# Patient Record
Sex: Male | Born: 1940 | Race: White | Hispanic: No | Marital: Married | Smoking: Former smoker
Health system: Southern US, Community
[De-identification: ages and names within clinical notes are randomized; demographics above are authoritative.]

## PROBLEM LIST (undated history)

## (undated) DIAGNOSIS — K219 Gastro-esophageal reflux disease without esophagitis: Secondary | ICD-10-CM

## (undated) DIAGNOSIS — J8410 Pulmonary fibrosis, unspecified: Secondary | ICD-10-CM

## (undated) DIAGNOSIS — C61 Malignant neoplasm of prostate: Secondary | ICD-10-CM

## (undated) DIAGNOSIS — E039 Hypothyroidism, unspecified: Secondary | ICD-10-CM

## (undated) DIAGNOSIS — E7800 Pure hypercholesterolemia, unspecified: Secondary | ICD-10-CM

## (undated) HISTORY — DX: Pulmonary fibrosis, unspecified: J84.10

## (undated) HISTORY — DX: Hypothyroidism, unspecified: E03.9

## (undated) HISTORY — DX: Malignant neoplasm of prostate: C61

## (undated) HISTORY — DX: Pure hypercholesterolemia, unspecified: E78.00

---

## 2002-07-02 ENCOUNTER — Emergency Department (HOSPITAL_COMMUNITY): Admission: EM | Admit: 2002-07-02 | Discharge: 2002-07-02 | Payer: Self-pay | Admitting: Emergency Medicine

## 2005-12-28 ENCOUNTER — Encounter: Admission: RE | Admit: 2005-12-28 | Discharge: 2005-12-28 | Payer: Self-pay | Admitting: Family Medicine

## 2007-04-24 ENCOUNTER — Encounter: Admission: RE | Admit: 2007-04-24 | Discharge: 2007-04-24 | Payer: Self-pay | Admitting: Family Medicine

## 2008-08-19 ENCOUNTER — Ambulatory Visit: Payer: Self-pay | Admitting: Vascular Surgery

## 2008-09-02 ENCOUNTER — Encounter (INDEPENDENT_AMBULATORY_CARE_PROVIDER_SITE_OTHER): Payer: Self-pay | Admitting: Cardiology

## 2008-09-02 ENCOUNTER — Ambulatory Visit (HOSPITAL_COMMUNITY): Admission: RE | Admit: 2008-09-02 | Discharge: 2008-09-02 | Payer: Self-pay | Admitting: Cardiology

## 2010-02-07 HISTORY — PX: INSERTION PROSTATE RADIATION SEED: SUR718

## 2010-02-10 ENCOUNTER — Ambulatory Visit (HOSPITAL_COMMUNITY)
Admission: RE | Admit: 2010-02-10 | Discharge: 2010-02-10 | Payer: Self-pay | Source: Home / Self Care | Attending: Urology | Admitting: Urology

## 2010-02-12 ENCOUNTER — Ambulatory Visit
Admission: RE | Admit: 2010-02-12 | Discharge: 2010-03-09 | Payer: Self-pay | Source: Home / Self Care | Attending: Radiation Oncology | Admitting: Radiation Oncology

## 2010-03-10 ENCOUNTER — Ambulatory Visit: Payer: 59 | Attending: Radiation Oncology | Admitting: Radiation Oncology

## 2010-03-10 DIAGNOSIS — R3911 Hesitancy of micturition: Secondary | ICD-10-CM | POA: Insufficient documentation

## 2010-03-10 DIAGNOSIS — C61 Malignant neoplasm of prostate: Secondary | ICD-10-CM | POA: Insufficient documentation

## 2010-03-10 DIAGNOSIS — Z510 Encounter for antineoplastic radiation therapy: Secondary | ICD-10-CM | POA: Insufficient documentation

## 2010-03-10 DIAGNOSIS — R350 Frequency of micturition: Secondary | ICD-10-CM | POA: Insufficient documentation

## 2010-03-19 ENCOUNTER — Other Ambulatory Visit: Payer: Self-pay | Admitting: Urology

## 2010-03-19 ENCOUNTER — Inpatient Hospital Stay (HOSPITAL_BASED_OUTPATIENT_CLINIC_OR_DEPARTMENT_OTHER)
Admission: RE | Admit: 2010-03-19 | Discharge: 2010-03-19 | Disposition: A | Discharge status: Home / Self Care | Payer: Medicare Other | Source: Ambulatory Visit

## 2010-03-19 ENCOUNTER — Ambulatory Visit
Admission: RE | Admit: 2010-03-19 | Discharge: 2010-03-19 | Disposition: A | Discharge status: Home / Self Care | Payer: 59 | Source: Ambulatory Visit | Attending: Urology | Admitting: Urology

## 2010-03-19 DIAGNOSIS — Z01811 Encounter for preprocedural respiratory examination: Secondary | ICD-10-CM

## 2010-03-19 DIAGNOSIS — C61 Malignant neoplasm of prostate: Secondary | ICD-10-CM

## 2010-04-20 ENCOUNTER — Ambulatory Visit (HOSPITAL_COMMUNITY)
Admission: RE | Admit: 2010-04-20 | Discharge: 2010-04-20 | Disposition: A | Discharge status: Home / Self Care | Payer: 59 | Source: Ambulatory Visit | Attending: Urology | Admitting: Urology

## 2010-04-20 LAB — COMPREHENSIVE METABOLIC PANEL
ALT: 33 U/L (ref 0–53)
Albumin: 4.1 g/dL (ref 3.5–5.2)
Alkaline Phosphatase: 48 U/L (ref 39–117)
Glucose, Bld: 95 mg/dL (ref 70–99)
Potassium: 4.5 mEq/L (ref 3.5–5.1)
Sodium: 136 mEq/L (ref 135–145)
Total Protein: 6.8 g/dL (ref 6.0–8.3)

## 2010-04-20 LAB — CBC
HCT: 41.5 % (ref 39.0–52.0)
Hemoglobin: 14.6 g/dL (ref 13.0–17.0)
MCHC: 35.2 g/dL (ref 30.0–36.0)

## 2010-04-20 LAB — PROTIME-INR: INR: 1.01 (ref 0.00–1.49)

## 2010-04-20 LAB — APTT: aPTT: 34 seconds (ref 24–37)

## 2010-04-27 ENCOUNTER — Ambulatory Visit (HOSPITAL_COMMUNITY): Payer: 59 | Attending: Urology

## 2010-04-27 ENCOUNTER — Ambulatory Visit (HOSPITAL_BASED_OUTPATIENT_CLINIC_OR_DEPARTMENT_OTHER)
Admission: RE | Admit: 2010-04-27 | Discharge: 2010-04-27 | Disposition: A | Discharge status: Home / Self Care | Payer: 59 | Source: Ambulatory Visit | Attending: Urology | Admitting: Urology

## 2010-04-27 DIAGNOSIS — Z01818 Encounter for other preprocedural examination: Secondary | ICD-10-CM | POA: Insufficient documentation

## 2010-04-27 DIAGNOSIS — C61 Malignant neoplasm of prostate: Secondary | ICD-10-CM | POA: Insufficient documentation

## 2010-04-27 DIAGNOSIS — Z79899 Other long term (current) drug therapy: Secondary | ICD-10-CM | POA: Insufficient documentation

## 2010-04-27 DIAGNOSIS — Z01810 Encounter for preprocedural cardiovascular examination: Secondary | ICD-10-CM | POA: Insufficient documentation

## 2010-04-27 DIAGNOSIS — Z510 Encounter for antineoplastic radiation therapy: Secondary | ICD-10-CM | POA: Insufficient documentation

## 2010-04-27 DIAGNOSIS — Z01812 Encounter for preprocedural laboratory examination: Secondary | ICD-10-CM | POA: Insufficient documentation

## 2010-05-03 NOTE — Op Note (Signed)
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  OPERATION:  Prostate brachytherapy, cystoscopy, fluoroscopy.  ANESTHESIA:  General.  SURGEON: 1. Courtney Paris, MD 2. 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  He is admitted now for definitive seed implant for therapy.  He has had 5 weeks of external radiation and has been followed by the seeds today.  His PSA was 5.3 in October 2011.  He had high risk prostate cancer with a large volume of this 20 g prostate involved by Gleason 4+3 adenocarcinoma of the prostate.  He did well with the external radiation, but required Flomax halfway through.  He enters now for the seed therapy, understanding risks and complications there.  The patient was placed on the operating table in dorsal lithotomy position after satisfactory induction of general anesthesia, was prepped and draped with Betadine in the usual sterile fashion.  He is given IV antibiotics.  Time-out was then performed and the patient and procedure then reconfirmed.  He was prepped and draped and the Foley catheter, rectal tube, and the 7.5 MHz ultrasound probe was inserted in his prostate.  Dr. Dayton Scrape then planned the treatment.  I was called back to the operating room after the treatment plan was completed.  A second time-out was then performed.  The needles were then placed with the Nucletron.  A total of 30 needles were used with 77 seeds.  One seed at the  end of the case jammed with the stylet from the Nucletron and had to be inserted manually.  At the completion of the case, the fluoroscopy was used to examine the seeds and they all looked like they conformed well to the prostate.  The patient was then reprepped and draped and a flexible cystoscopy was done and no anterior strictures were seen.  Posterior urethra was normal.  The bladder was entered.  The bladder was smooth, 2+ trabeculated, but otherwise had no bladder mucosal lesions seen.  No seeds were seen within the bladder.  Even on the retrograde view of the prostate, no seeds were seen.  Scope was removed.  A #18 Foley catheter was then reinserted and left to straight drainage.  The patient was then taken to recovery room in good condition to be later discharged as an outpatient.  Catheter will be removed in 48 hours and I will see him in 3 weeks for followup.     Courtney Paris, M.D.     HMK/MEDQ  D:  04/27/2010  T:  04/27/2010  Job:  [id number]  Electronically Signed by Vic Blackbird M.D. on 05/03/2010 05:22:48 PM

## 2010-05-18 ENCOUNTER — Ambulatory Visit: Payer: 59 | Attending: Radiation Oncology | Admitting: Radiation Oncology

## 2010-05-18 DIAGNOSIS — C61 Malignant neoplasm of prostate: Secondary | ICD-10-CM | POA: Insufficient documentation

## 2010-05-18 DIAGNOSIS — R3989 Other symptoms and signs involving the genitourinary system: Secondary | ICD-10-CM | POA: Insufficient documentation

## 2010-05-18 DIAGNOSIS — Z483 Aftercare following surgery for neoplasm: Secondary | ICD-10-CM | POA: Insufficient documentation

## 2010-05-18 DIAGNOSIS — Z923 Personal history of irradiation: Secondary | ICD-10-CM | POA: Insufficient documentation

## 2010-06-22 NOTE — Procedures (Signed)
CAROTID DUPLEX EXAM   INDICATION:  Questionable right retinal occlusion.   HISTORY:  Diabetes:  No  Cardiac:  No  Hypertension:  No  Smoking:  No  Previous Surgery:  No  CV History:  No  Amaurosis Fugax No, Paresthesias No, Hemiparesis No                                       RIGHT             LEFT  Brachial systolic pressure:         160               150  Brachial Doppler waveforms:         Biphasic          Biphasic  Vertebral direction of flow:        Antegrade         Antegrade  DUPLEX VELOCITIES (cm/sec)  CCA peak systolic                   80                85  ECA peak systolic                   114               110  ICA peak systolic                   56                74  ICA end diastolic                   21                19  PLAQUE MORPHOLOGY:                  Calcified         Calcified  PLAQUE AMOUNT:                      Mild              Mild  PLAQUE LOCATION:                    ICA and ECA       ICA and ECA   IMPRESSION:  1. 20-39% stenosis noted in bilateral internal carotid arteries.  2. Increased velocity noted in the left vertebral artery.  3. Antegrade bilateral vertebral arteries.        ___________________________________________  Quita Skye [place], M.D.   MG/MEDQ  D:  08/19/2008  T:  08/19/2008  Job:  [id number]

## 2010-09-01 ENCOUNTER — Ambulatory Visit
Admission: RE | Admit: 2010-09-01 | Discharge: 2010-09-01 | Disposition: A | Discharge status: Home / Self Care | Payer: Medicare Other | Source: Ambulatory Visit | Attending: Radiation Oncology | Admitting: Radiation Oncology

## 2010-11-03 ENCOUNTER — Ambulatory Visit: Payer: Medicare Other | Admitting: Radiation Oncology

## 2010-11-09 ENCOUNTER — Ambulatory Visit
Admission: RE | Admit: 2010-11-09 | Discharge: 2010-11-09 | Disposition: A | Discharge status: Home / Self Care | Payer: Medicare Other | Source: Ambulatory Visit | Attending: Radiation Oncology | Admitting: Radiation Oncology

## 2011-04-28 ENCOUNTER — Other Ambulatory Visit: Payer: Self-pay | Admitting: Cardiology

## 2013-01-15 ENCOUNTER — Other Ambulatory Visit: Payer: Self-pay | Admitting: Family Medicine

## 2013-01-15 ENCOUNTER — Other Ambulatory Visit (HOSPITAL_COMMUNITY): Payer: Self-pay | Admitting: Family Medicine

## 2013-01-15 ENCOUNTER — Ambulatory Visit
Admission: RE | Admit: 2013-01-15 | Discharge: 2013-01-15 | Disposition: A | Discharge status: Home / Self Care | Payer: Medicare Other | Source: Ambulatory Visit | Attending: Family Medicine | Admitting: Family Medicine

## 2013-01-15 DIAGNOSIS — R0602 Shortness of breath: Secondary | ICD-10-CM

## 2013-01-15 DIAGNOSIS — R0600 Dyspnea, unspecified: Secondary | ICD-10-CM

## 2013-01-15 LAB — PULMONARY FUNCTION TEST
DL/VA: 2.25 ml/min/mmHg/L
FEF2575-%Change-Post: -62 %
FEF2575-%Pred-Post: 44 %
FEV1-%Pred-Post: 65 %
FEV1-%Pred-Pre: 78 %
FEV1-Post: 1.89 L
FEV1-Pre: 2.28 L
FEV1FVC-%Change-Post: -6 %
FEV1FVC-%Pred-Pre: 117 %
FVC-%Change-Post: -11 %
Pre FEV1/FVC ratio: 86 %
Pre FEV6/FVC Ratio: 100 %
RV % pred: 67 %
TLC: 4.25 L

## 2013-01-21 ENCOUNTER — Encounter (HOSPITAL_COMMUNITY): Payer: Medicare Other

## 2013-01-25 ENCOUNTER — Ambulatory Visit (HOSPITAL_COMMUNITY)
Admission: RE | Admit: 2013-01-25 | Discharge: 2013-01-25 | Disposition: A | Discharge status: Home / Self Care | Payer: Medicare Other | Source: Ambulatory Visit | Attending: Family Medicine | Admitting: Family Medicine

## 2013-01-25 DIAGNOSIS — R059 Cough, unspecified: Secondary | ICD-10-CM | POA: Insufficient documentation

## 2013-01-25 DIAGNOSIS — R0989 Other specified symptoms and signs involving the circulatory and respiratory systems: Secondary | ICD-10-CM | POA: Insufficient documentation

## 2013-01-25 DIAGNOSIS — R0609 Other forms of dyspnea: Secondary | ICD-10-CM | POA: Insufficient documentation

## 2013-01-25 DIAGNOSIS — R05 Cough: Secondary | ICD-10-CM | POA: Insufficient documentation

## 2013-01-25 DIAGNOSIS — R0602 Shortness of breath: Secondary | ICD-10-CM | POA: Insufficient documentation

## 2013-01-25 MED ORDER — ALBUTEROL SULFATE (5 MG/ML) 0.5% IN NEBU
2.5000 mg | INHALATION_SOLUTION | Freq: Once | RESPIRATORY_TRACT | Status: AC
  Administered 2013-01-25: 2.5 mg via RESPIRATORY_TRACT

## 2013-01-28 ENCOUNTER — Ambulatory Visit
Admission: RE | Admit: 2013-01-28 | Discharge: 2013-01-28 | Disposition: A | Discharge status: Home / Self Care | Payer: Medicare Other | Source: Ambulatory Visit | Attending: Family Medicine | Admitting: Family Medicine

## 2013-01-28 ENCOUNTER — Other Ambulatory Visit: Payer: Self-pay | Admitting: Family Medicine

## 2013-01-28 ENCOUNTER — Encounter (HOSPITAL_COMMUNITY): Payer: Medicare Other

## 2013-01-28 DIAGNOSIS — R0600 Dyspnea, unspecified: Secondary | ICD-10-CM

## 2013-02-01 ENCOUNTER — Telehealth: Payer: Self-pay | Admitting: Internal Medicine

## 2013-02-01 NOTE — Telephone Encounter (Signed)
Called and spoke with pts wife and she stated that the pt was referred to see MW by Dr. Clovis Riley.  She stated that they gave pt an inhaler of the proair on Friday and this is not helping him and his SOB is worse today.  i advised the pts wife to call Dr. Quita Skye office to see if they can offer any other recs.  She wanted to know if MW may have a cancellation on Monday so the pt may be seen earlier.  Will forward to MW and leslie to see if pt can be worked in on Monday.  Thanks   Allergies not on file

## 2013-02-03 NOTE — Telephone Encounter (Signed)
Ok to work in at end of am or pm office

## 2013-02-04 ENCOUNTER — Encounter: Payer: Self-pay | Admitting: Internal Medicine

## 2013-02-04 ENCOUNTER — Ambulatory Visit (INDEPENDENT_AMBULATORY_CARE_PROVIDER_SITE_OTHER): Payer: Medicare Other | Admitting: Internal Medicine

## 2013-02-04 VITALS — BP 122/80 | HR 80 | Temp 97.6°F | Ht 68.0 in | Wt 172.8 lb

## 2013-02-04 DIAGNOSIS — J8410 Pulmonary fibrosis, unspecified: Secondary | ICD-10-CM

## 2013-02-04 DIAGNOSIS — J9610 Chronic respiratory failure, unspecified whether with hypoxia or hypercapnia: Secondary | ICD-10-CM

## 2013-02-04 DIAGNOSIS — J84112 Idiopathic pulmonary fibrosis: Secondary | ICD-10-CM | POA: Insufficient documentation

## 2013-02-04 MED ORDER — PANTOPRAZOLE SODIUM 40 MG PO TBEC: 40.0000 mg | DELAYED_RELEASE_TABLET | Freq: Every day | ORAL | Status: DC

## 2013-02-04 MED ORDER — FAMOTIDINE 20 MG PO TABS: ORAL_TABLET | ORAL | Status: DC

## 2013-02-04 NOTE — Patient Instructions (Signed)
Please see patient coordinator before you leave today  to schedule High resolution CT chest  Pantoprazole (protonix) 40 mg Take 30-60 min before first meal of the day and Pepcid 20 mg one bedtime until return to office - this is the best way to tell whether stomach acid is contributing to your problem.   GERD (REFLUX)  is an extremely common cause of respiratory symptoms, many times with no significant heartburn at all.    It can be treated with medication, but also with lifestyle changes including avoidance of late meals, excessive alcohol, smoking cessation, and avoid fatty foods, chocolate, peppermint, colas, red wine, and acidic juices such as orange juice.  NO MINT OR MENTHOL PRODUCTS SO NO COUGH DROPS  USE SUGARLESS CANDY INSTEAD (jolley ranchers or Stover's)  NO OIL BASED VITAMINS - use powdered substitutes.   Please schedule a follow up office visit in 4 weeks, call sooner if needed

## 2013-02-04 NOTE — Progress Notes (Signed)
Subjective:     Patient ID: Bernard Griffith, male   DOB: 08-28-1940   MRN: [id number]  HPI  73 yowm quit smoking in 1962 with dx pna around 2011 and doe ever since eval by cardiology with "ok heart" and referred 02/04/2013 to pulmonary clinic by Dr Lupe Carney with ? PF.  02/04/2013 1st [facility] office visit/ Bernard Griffith cc new onset doe x 3 years abrupt onset dx as pna and progressively worse to point where  can barely get across the parking lot, with freq throat clearing. No better on saba   H/o prostate ca but no systemic chemo/ no h/o Rheumatism  No obvious day to day or daytime variabilty or assoc chronic cough or cp or chest tightness, subjective wheeze overt sinus or hb symptoms. No unusual exp hx or h/o childhood pna/ asthma or knowledge of premature birth.  Sleeping ok without nocturnal  or early am exacerbation  of respiratory  c/o's or need for noct saba. Also denies any obvious fluctuation of symptoms with weather or environmental changes or other aggravating or alleviating factors except as outlined above   Current Medications, Allergies, Complete Past Medical History, Past Surgical History, Family History, and Social History were reviewed in [employer] record.  ROS  The following are not active complaints unless bolded sore throat, dysphagia, dental problems, itching, sneezing,  nasal congestion or excess/ purulent secretions, ear ache,   fever, chills, sweats, unintended wt loss, pleuritic or exertional cp, hemoptysis,  orthopnea pnd or leg swelling, presyncope, palpitations, heartburn, abdominal pain, anorexia, nausea, vomiting, diarrhea  or change in bowel or urinary habits, change in stools or urine, dysuria,hematuria,  rash, arthralgias, visual complaints, headache, numbness weakness or ataxia or problems with walking or coordination,  change in mood/affect or memory.       Review of Systems     Objective:   Physical Exam  amb wm with freq  throat clearing  Wt Readings from Last 3 Encounters:  02/04/13 172 lb 12.8 oz (78.382 kg)      HEENT: nl dentition, turbinates, and orophanx. Nl external ear canals without cough reflex   NECK :  without JVD/Nodes/TM/ nl carotid upstrokes bilaterally   LUNGS: no acc muscle use, mild BV changes bases but no sign insp crackles    CV:  RRR  no s3 or murmur or increase in P2, no edema   ABD:  soft and nontender with nl excursion in the supine position. No bruits or organomegaly, bowel sounds nl  MS:  warm without deformities, calf tenderness, cyanosis  - no clubbing   SKIN: warm and dry without lesions    NEURO:  alert, approp, no deficits   Labs 02/04/13 Nl cbc  cxr 01/28/13 The patient has chronic progressive interstitial lung disease. No  acute abnormalities.      Assessment:

## 2013-02-04 NOTE — Telephone Encounter (Signed)
Spoke with the spouse and scheduled appt for today at 1:30 pt to arrive at 1:15.

## 2013-02-05 ENCOUNTER — Encounter (HOSPITAL_COMMUNITY): Payer: Medicare Other

## 2013-02-05 DIAGNOSIS — J9620 Acute and chronic respiratory failure, unspecified whether with hypoxia or hypercapnia: Secondary | ICD-10-CM | POA: Insufficient documentation

## 2013-02-05 NOTE — Assessment & Plan Note (Addendum)
-   02/04/2013  Walked RA x 1 laps @ 185 ft each stopped due to desat 86 without sob - 01/15/2013 PFT's  VC 2.62 (65%) s obst and DLCO 27 corrects to 50%   DDx for pulmonary fibrosis  includes idiopathic pulmonary fibrosis, pulmonary fibrosis associated with rheumatologic diseases (which have a relatively benign course in most cases) , adverse effect from  drugs such as chemotherapy or amiodarone exposure, nonspecific interstitial pneumonia which is typically steroid responsive, and chronic hypersensitivity pneumonitis.   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  Dec 2011  This may not be cause and effect, but given how universally unhelpful all the otherstudy drugs have been for pf,   rec start  rx ppi / diet/ lifestyle modification.    Will return in 4 weeks to regroup with another point on the curve for walking sats and HRCT chest in meantime.

## 2013-02-05 NOTE — Assessment & Plan Note (Signed)
-    02/04/2013   Walked RA x one lap @ 185 stopped due to  desat to 86% s symptoms  Since has no symptoms with desats walking at nl pace I suspect this problem is very longstanding and no need to start 02 yet.

## 2013-02-08 ENCOUNTER — Institutional Professional Consult (permissible substitution): Payer: Medicare Other | Admitting: Internal Medicine

## 2013-02-11 ENCOUNTER — Institutional Professional Consult (permissible substitution): Payer: Medicare Other | Admitting: Internal Medicine

## 2013-02-13 ENCOUNTER — Ambulatory Visit (INDEPENDENT_AMBULATORY_CARE_PROVIDER_SITE_OTHER)
Admission: RE | Admit: 2013-02-13 | Discharge: 2013-02-13 | Disposition: A | Discharge status: Home / Self Care | Payer: Medicare Other | Source: Ambulatory Visit | Attending: Internal Medicine | Admitting: Internal Medicine

## 2013-02-13 ENCOUNTER — Encounter: Payer: Self-pay | Admitting: Internal Medicine

## 2013-02-13 DIAGNOSIS — J8410 Pulmonary fibrosis, unspecified: Secondary | ICD-10-CM

## 2013-02-14 NOTE — Progress Notes (Signed)
Quick Note:  Spoke with pt and notified of results per Dr. Wert. Pt verbalized understanding and denied any questions.  ______ 

## 2013-03-05 ENCOUNTER — Encounter: Payer: Self-pay | Admitting: Internal Medicine

## 2013-03-05 ENCOUNTER — Ambulatory Visit: Payer: Medicare Other | Admitting: Internal Medicine

## 2013-03-05 ENCOUNTER — Ambulatory Visit (INDEPENDENT_AMBULATORY_CARE_PROVIDER_SITE_OTHER): Payer: Medicare Other | Admitting: Internal Medicine

## 2013-03-05 ENCOUNTER — Other Ambulatory Visit (INDEPENDENT_AMBULATORY_CARE_PROVIDER_SITE_OTHER): Payer: Medicare Other

## 2013-03-05 VITALS — BP 114/78 | HR 75 | Temp 98.2°F | Ht 68.0 in | Wt 169.0 lb

## 2013-03-05 DIAGNOSIS — J8410 Pulmonary fibrosis, unspecified: Secondary | ICD-10-CM

## 2013-03-05 DIAGNOSIS — J9610 Chronic respiratory failure, unspecified whether with hypoxia or hypercapnia: Secondary | ICD-10-CM

## 2013-03-05 LAB — CBC WITH DIFFERENTIAL/PLATELET
BASOS ABS: 0 10*3/uL (ref 0.0–0.1)
BASOS PCT: 0.4 % (ref 0.0–3.0)
EOS ABS: 0.2 10*3/uL (ref 0.0–0.7)
Eosinophils Relative: 2.7 % (ref 0.0–5.0)
HEMATOCRIT: 48.2 % (ref 39.0–52.0)
HEMOGLOBIN: 16.5 g/dL (ref 13.0–17.0)
LYMPHS ABS: 1.7 10*3/uL (ref 0.7–4.0)
Lymphocytes Relative: 18.8 % (ref 12.0–46.0)
MCHC: 34.2 g/dL (ref 30.0–36.0)
MCV: 88.8 fl (ref 78.0–100.0)
MONO ABS: 0.8 10*3/uL (ref 0.1–1.0)
Monocytes Relative: 9.2 % (ref 3.0–12.0)
NEUTROS ABS: 6.1 10*3/uL (ref 1.4–7.7)
Neutrophils Relative %: 68.9 % (ref 43.0–77.0)
Platelets: 275 10*3/uL (ref 150.0–400.0)
RBC: 5.43 Mil/uL (ref 4.22–5.81)
RDW: 14 % (ref 11.5–14.6)
WBC: 8.9 10*3/uL (ref 4.5–10.5)

## 2013-03-05 LAB — RHEUMATOID FACTOR: Rhuematoid fact SerPl-aCnc: 12 IU/mL (ref <=14)

## 2013-03-05 MED ORDER — PREDNISONE 10 MG PO TABS: ORAL_TABLET | ORAL | Status: DC

## 2013-03-05 NOTE — Progress Notes (Signed)
Subjective:     Patient ID: Bernard Griffith, male   DOB: [birth year]   MRN: [id number]    Brief patient profile:  44 yowm quit smoking in 1962 with dx pna around 2011 and doe ever since eval by cardiology with "ok heart" and referred 02/04/2013 to pulmonary clinic by Dr Donnie Coffin with ? PF.   History of Present Illness  02/04/2013 1st [facility] office visit/ Bernard Griffith cc new onset doe x 3 years abrupt onset dx as pna and progressively worse to point where  can barely get across the parking lot, with freq throat clearing. No better on saba   H/o prostate ca but no systemic chemo/ no h/o Rheumatism rec Please see patient coordinator before you leave today  to schedule High resolution CT chest Pantoprazole (protonix) 40 mg Take 30-60 min before first meal of the day and Pepcid 20 mg one bedtime until return to office - this is the best way to tell whether stomach acid is contributing to your problem.  GERD (REFLUX)   03/05/2013 f/u ov/Bernard Griffith re: cough/ PF Chief Complaint  Patient presents with  . Follow-up    Pt c/o increased SOB with exertion, occasional nonprod cough     No obvious day to day or daytime variabilty or assoc chronic cough or cp or chest tightness, subjective wheeze overt sinus or hb symptoms. No unusual exp hx or h/o childhood pna/ asthma or knowledge of premature birth.  Sleeping ok without nocturnal  or early am exacerbation  of respiratory  c/o's or need for noct saba. Also denies any obvious fluctuation of symptoms with weather or environmental changes or other aggravating or alleviating factors except as outlined above   Current Medications, Allergies, Complete Past Medical History, Past Surgical History, Family History, and Social History were reviewed in [employer] record.  ROS  The following are not active complaints unless bolded sore throat, dysphagia, dental problems, itching, sneezing,  nasal congestion or excess/ purulent secretions,  ear ache,   fever, chills, sweats, unintended wt loss, pleuritic or exertional cp, hemoptysis,  orthopnea pnd or leg swelling, presyncope, palpitations, heartburn, abdominal pain, anorexia, nausea, vomiting, diarrhea  or change in bowel or urinary habits, change in stools or urine, dysuria,hematuria,  rash, arthralgias, visual complaints, headache, numbness weakness or ataxia or problems with walking or coordination,  change in mood/affect or memory.             Objective:   Physical Exam  amb wm with freq throat clearing  Wt Readings from Last 3 Encounters:  03/05/13 169 lb (76.658 kg)  02/04/13 172 lb 12.8 oz (78.382 kg)         HEENT: nl dentition, turbinates, and orophanx. Nl external ear canals without cough reflex   NECK :  without JVD/Nodes/TM/ nl carotid upstrokes bilaterally   LUNGS: no acc muscle use, mild BV changes bases but no sign insp crackles    CV:  RRR  no s3 or murmur or increase in P2, no edema   ABD:  soft and nontender with nl excursion in the supine position. No bruits or organomegaly, bowel sounds nl  MS:  warm without deformities, calf tenderness, cyanosis  - no clubbing   SKIN: warm and dry without lesions    NEURO:  alert, approp, no deficits   Labs 02/04/13 Nl cbc  cxr 01/28/13 The patient has chronic progressive interstitial lung disease. No  acute abnormalities.      Assessment:

## 2013-03-05 NOTE — Patient Instructions (Addendum)
You have a condition called pulmonary fibrosis that means scarring in the part of your lungs where oxygen is absorbed as is making you short of breath.  The cause is unclear = Idiopathic pulmonary fibrosis  Take Prednisone 4 for three days, 3 for three days, 2 for three days, 1 for three days and stop   Please remember to go to the lab  department downstairs for your tests - we will call you with the results when they are available.  Please schedule a follow up office visit in 2 weeks, sooner if needed

## 2013-03-06 LAB — BASIC METABOLIC PANEL
BUN: 13 mg/dL (ref 6–23)
CALCIUM: 9.5 mg/dL (ref 8.4–10.5)
CO2: 22 mEq/L (ref 19–32)
CREATININE: 0.9 mg/dL (ref 0.4–1.5)
Chloride: 102 mEq/L (ref 96–112)
GFR: 85.66 mL/min (ref >60.00)
Glucose, Bld: 97 mg/dL (ref 70–99)
Potassium: 4.5 mEq/L (ref 3.5–5.1)
Sodium: 134 mEq/L — ABNORMAL LOW (ref 135–145)

## 2013-03-06 LAB — SEDIMENTATION RATE: SED RATE: 20 mm/h (ref 0–22)

## 2013-03-06 LAB — CYCLIC CITRUL PEPTIDE ANTIBODY, IGG: Cyclic Citrullin Peptide Ab: <2 U/mL (ref 0.0–5.0)

## 2013-03-06 LAB — ANA: Anti Nuclear Antibody(ANA): NEGATIVE

## 2013-03-06 NOTE — Progress Notes (Signed)
Quick Note:  Spoke with pt and notified of results per Dr. Wert. Pt verbalized understanding and denied any questions.  ______ 

## 2013-03-07 NOTE — Assessment & Plan Note (Signed)
-    02/04/2013   Walked RA x one lap @ 185 stopped due to  desat to 86% s symptoms  Already qualifies for amb 02 but would rather not start it yet.

## 2013-03-19 ENCOUNTER — Telehealth: Payer: Self-pay | Admitting: Internal Medicine

## 2013-03-19 ENCOUNTER — Ambulatory Visit (INDEPENDENT_AMBULATORY_CARE_PROVIDER_SITE_OTHER): Payer: Medicare Other | Admitting: Internal Medicine

## 2013-03-19 ENCOUNTER — Encounter: Payer: Self-pay | Admitting: Internal Medicine

## 2013-03-19 VITALS — BP 126/72 | HR 69 | Temp 98.5°F | Ht 68.0 in | Wt 175.8 lb

## 2013-03-19 DIAGNOSIS — J9610 Chronic respiratory failure, unspecified whether with hypoxia or hypercapnia: Secondary | ICD-10-CM

## 2013-03-19 DIAGNOSIS — J8410 Pulmonary fibrosis, unspecified: Secondary | ICD-10-CM

## 2013-03-19 DIAGNOSIS — R0902 Hypoxemia: Secondary | ICD-10-CM | POA: Insufficient documentation

## 2013-03-19 MED ORDER — NINTEDANIB ESYLATE 150 MG PO CAPS: ORAL_CAPSULE | ORAL | Status: DC

## 2013-03-19 NOTE — Patient Instructions (Addendum)
OFEV 150 mg twice daily (this will be sent to you in the mail) when you return from your cruise - if diarrhea just take once daily   Please see patient coordinator before you leave today  to schedule  Ambulatory 02:  Wear 02 2lpm with walking outside the house  Please schedule a follow up office visit in 6 weeks, call sooner if needed

## 2013-03-19 NOTE — Telephone Encounter (Signed)
Called and spoke with Maudie Mercury. Order has been placed. Nothing further needed

## 2013-03-19 NOTE — Telephone Encounter (Signed)
Per OV today: Please see patient coordinator before you leave today  to schedule  Ambulatory 02:  Wear 02 2lpm with walking outside the house --  I called and spoke with pt. He is aware. He needed nothing further

## 2013-03-19 NOTE — Progress Notes (Signed)
Subjective:     Patient ID: Bernard Griffith, male   DOB: 1940/09/29   MRN: [id number]    Brief patient profile:  45 yowm quit smoking in 1962 with dx pna around 2011 and doe ever since eval by cardiology with "ok heart" and referred 02/04/2013 to pulmonary clinic by Dr Donnie Coffin with ? PF.   History of Present Illness  02/04/2013 1st [facility] office visit/ Bernard Griffith cc new onset doe x 3 years abrupt onset dx as pna and progressively worse to point where  can barely get across the parking lot, with freq throat clearing. No better on saba  H/o prostate ca but no systemic chemo/ no h/o Rheumatism rec Please see patient coordinator before you leave today  to schedule High resolution CT chest Pantoprazole (protonix) 40 mg Take 30-60 min before first meal of the day and Pepcid 20 mg one bedtime until return to office - this is the best way to tell whether stomach acid is contributing to your problem.  GERD (REFLUX)   03/05/2013 f/u ov/Bernard Griffith re: cough/ PF Chief Complaint  Patient presents with  . Follow-up    Pt c/o increased SOB with exertion, occasional nonprod cough  rec You have a condition called pulmonary fibrosis that means scarring in the part of your lungs where oxygen is absorbed as is making you short of breath. The cause is unclear = Idiopathic pulmonary fibrosis Take Prednisone 4 for three days, 3 for three days, 2 for three days, 1 for three days and stop  Please remember to go to the lab  Department> neg for collagen vasc profile   03/19/2013 f/u ov/Bernard Griffith [facility] re: PF no better p pred rx maint on gerd rx  Chief Complaint  Patient presents with  . Follow-up    Pt c/o nonprod cough, SOB with exertion.     No obvious day to day or daytime variabilty or assoc  cp or chest tightness, subjective wheeze overt sinus or hb symptoms. No unusual exp hx or h/o childhood pna/ asthma or knowledge of premature birth.  Sleeping ok without nocturnal  or early am exacerbation  of respiratory   c/o's or need for noct saba. Also denies any obvious fluctuation of symptoms with weather or environmental changes or other aggravating or alleviating factors except as outlined above   Current Medications, Allergies, Complete Past Medical History, Past Surgical History, Family History, and Social History were reviewed in [employer] record.  ROS  The following are not active complaints unless bolded sore throat, dysphagia, dental problems, itching, sneezing,  nasal congestion or excess/ purulent secretions, ear ache,   fever, chills, sweats, unintended wt loss, pleuritic or exertional cp, hemoptysis,  orthopnea pnd or leg swelling, presyncope, palpitations, heartburn, abdominal pain, anorexia, nausea, vomiting, diarrhea  or change in bowel or urinary habits, change in stools or urine, dysuria,hematuria,  rash, arthralgias, visual complaints, headache, numbness weakness or ataxia or problems with walking or coordination,  change in mood/affect or memory.             Objective:   Physical Exam  amb wm much less  freq throat clearing  Wt Readings from Last 3 Encounters:  03/19/13 175 lb 12.8 oz (79.742 kg)  03/05/13 169 lb (76.658 kg)  02/04/13 172 lb 12.8 oz (78.382 kg)            HEENT: nl dentition, turbinates, and orophanx. Nl external ear canals without cough reflex   NECK :  without JVD/Nodes/TM/ nl  carotid upstrokes bilaterally   LUNGS: no acc muscle use, mild BV changes bases but no sign insp crackles    CV:  RRR  no s3 or murmur or increase in P2, no edema   ABD:  soft and nontender with nl excursion in the supine position. No bruits or organomegaly, bowel sounds nl  MS:  warm without deformities, calf tenderness, cyanosis  - no def  clubbing                Assessment:

## 2013-03-20 ENCOUNTER — Telehealth: Payer: Self-pay | Admitting: Internal Medicine

## 2013-03-20 NOTE — Telephone Encounter (Signed)
Was advised I needed to call [phone] to initiate PA. Member ID [id number] Called # and was advised this is [employer] member and will be transferred to # [phone] Spoke with Michiel Cowboy. She is faxing over a non formulary we have to fill out and fax back.  Will await fax

## 2013-03-20 NOTE — Assessment & Plan Note (Signed)
-   02/04/2013  Walked RA x 1 laps @ 185 ft each stopped due to desat 86 without sob - 01/15/2013 PFT's  VC 2.62 (65%) s obst and DLCO 27 corrects to 50%  - HRCT 02/13/2013 > 1. Pulmonary parenchymal pattern of pulmonary fibrosis is most consistent with usual interstitial pneumonitis. 2. Borderline mediastinal adenopathy can be seen in the setting of interstitial lung disease. 3. Enlarged pulmonary arteries, indicative of pulmonary arterial Hypertension. - Collagen vasc screen 03/05/13   ESR20/ neg ana, RA and CCP > rec rx 2 weeks prednisone then return to consider OFEV > no better 03/19/13 so rec start OFEV when returns from cruise 04/2013  I had an extended discussion with the patient today lasting 15 to 20 minutes of a 25 minute visit on the following issues: naturual hx/ points on the curve reviewed and rec start OFEV but leaving on cruise and concerned about reported diarrhea as main side effect so ok to start when returns. Paperwork completed

## 2013-03-20 NOTE — Telephone Encounter (Signed)
Form received and placed in MW look at. Please advise once done thanks

## 2013-03-20 NOTE — Assessment & Plan Note (Addendum)
-    02/04/2013   Walked RA x one lap @ 185 stopped due to  desat to 86% s symptoms  - 03/19/2013   Walked RA x one lap @ 185 stopped due to desat to 81% > on 2lpm walked one lap with sat 91% but still dropped on second - Rx 2 lpm with activity as of 03/19/13

## 2013-03-25 ENCOUNTER — Ambulatory Visit (INDEPENDENT_AMBULATORY_CARE_PROVIDER_SITE_OTHER): Payer: Medicare Other | Admitting: Emergency Medicine

## 2013-03-25 ENCOUNTER — Encounter: Payer: Self-pay | Admitting: Emergency Medicine

## 2013-03-25 ENCOUNTER — Other Ambulatory Visit: Payer: Medicare Other

## 2013-03-25 VITALS — BP 140/88 | HR 92 | Ht 68.0 in | Wt 170.0 lb

## 2013-03-25 DIAGNOSIS — J8410 Pulmonary fibrosis, unspecified: Secondary | ICD-10-CM

## 2013-03-25 MED ORDER — PREDNISONE 20 MG PO TABS: 20.0000 mg | ORAL_TABLET | Freq: Every day | ORAL | Status: DC

## 2013-03-25 NOTE — Progress Notes (Signed)
Subjective:    Patient ID: Bernard Griffith, male   DOB: 03/17/1940   MRN: [id number]  Brief patient profile:  14 yowm quit smoking in 1962 with dx pna around 2011 and doe ever since eval by cardiology with "ok heart" and referred 02/04/2013 to pulmonary clinic by Dr Donnie Coffin with ? PF.  History of Present Illness  02/04/2013 1st [facility] office visit/ Wert cc new onset doe x 3 years abrupt onset dx as pna and progressively worse to point where  can barely get across the parking lot, with freq throat clearing. No better on saba  H/o prostate ca but no systemic chemo/ no h/o Rheumatism rec Please see patient coordinator before you leave today  to schedule High resolution CT chest Pantoprazole (protonix) 40 mg Take 30-60 min before first meal of the day and Pepcid 20 mg one bedtime until return to office - this is the best way to tell whether stomach acid is contributing to your problem.  GERD (REFLUX)   03/05/2013 f/u ov/Wert re: cough/ PF Chief Complaint  Patient presents with  . Follow-up    Pt c/o increased SOB with exertion, occasional nonprod cough  rec You have a condition called pulmonary fibrosis that means scarring in the part of your lungs where oxygen is absorbed as is making you short of breath. The cause is unclear = Idiopathic pulmonary fibrosis Take Prednisone 4 for three days, 3 for three days, 2 for three days, 1 for three days and stop  Please remember to go to the lab  Department> neg for collagen vasc profile   03/19/13 -  f/u ov/Wert re: PF no better p pred rx maint on gerd rx  Chief Complaint  Patient presents with  . 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 A steroid taper was given 1/27 without response. His ANA and RF are negative. He is hypoxemic, started O2 3 days ago. He feels no better, in fact is  more SOB w exertion. He has cancelled his cruise.    Objective:   Physical Exam Filed Vitals:   03/25/13 1316  BP: 140/88  Pulse: 92  Height: 5\' 8"  (1.727 m)  Weight: 170 lb (77.111 kg)  SpO2: 90%   NECK :  without JVD/Nodes/TM/ nl carotid upstrokes bilaterally  LUNGS: B crackle on insp  CV:  RRR  no s3 or murmur or increase in P2, no edema   ABD:  soft and nontender with nl excursion in the supine position. No bruits or organomegaly, bowel sounds nl  MS:  warm without deformities, calf tenderness, clubbing     Assessment:    Postinflammatory pulmonary fibrosis Etiology unclear. RF and ANA negative . The rate of progression is consistent with an inflammatory insult. He is being prepped to start OFEV - check scl-70 and SSA, SSB - longer pred challenge.  - better compliance w O2 and discussed with him the target SpO2 > 90% using his home oximeter - f/u w Dr Melvyn Novas in 1 month to assess improvement. Based on his CT scan I doubt he will respond significantly to the pred challenge - discuss OFEV timing with Dr Melvyn Novas at his [place]

## 2013-03-25 NOTE — Telephone Encounter (Signed)
Form was faxed 03/22/13 and placed in MW scan folder LMTCB for Bernard Griffith

## 2013-03-25 NOTE — Telephone Encounter (Signed)
[facility] for status of prior auth [phone].  Satira Anis

## 2013-03-25 NOTE — Assessment & Plan Note (Signed)
Etiology unclear. RF and ANA negative . The rate of progression is consistent with an inflammatory insult. He is being prepped to start OFEV - check scl-70 and SSA, SSB - longer pred challenge.  - better compliance w O2 and discussed with him the target SpO2 > 90% using his home oximeter - f/u w Dr Melvyn Novas in 1 month to assess improvement. Based on his CT scan I doubt he will respond significantly to the pred challenge - discuss OFEV timing with Dr Melvyn Novas at his [place]

## 2013-03-25 NOTE — Patient Instructions (Signed)
Please increase oxygen to 3L/min at all times. Our goal is for your Oxygen saturation level to be over 90% Take prednisone 40mg  daily for the next 4 weeks Blood work today Follow with Dr Melvyn Novas in 1 month to discuss your results, your status on the prednisone and the timing to start your new medication OFEV

## 2013-03-26 ENCOUNTER — Ambulatory Visit: Payer: Medicare Other | Admitting: Emergency Medicine

## 2013-03-26 LAB — ANTI-SCLERODERMA ANTIBODY: SCLERODERMA (SCL-70) (ENA) ANTIBODY, IGG: NEGATIVE

## 2013-03-26 LAB — SJOGRENS SYNDROME-B EXTRACTABLE NUCLEAR ANTIBODY: SSB (LA) (ENA) ANTIBODY, IGG: NEGATIVE

## 2013-03-26 LAB — SJOGRENS SYNDROME-A EXTRACTABLE NUCLEAR ANTIBODY: SSA (Ro) (ENA) Antibody, IgG: <1

## 2013-03-27 ENCOUNTER — Telehealth: Payer: Self-pay | Admitting: Internal Medicine

## 2013-03-27 ENCOUNTER — Encounter: Payer: Self-pay | Admitting: *Deleted

## 2013-03-27 DIAGNOSIS — J9610 Chronic respiratory failure, unspecified whether with hypoxia or hypercapnia: Secondary | ICD-10-CM

## 2013-03-27 DIAGNOSIS — J8410 Pulmonary fibrosis, unspecified: Secondary | ICD-10-CM

## 2013-03-27 NOTE — Telephone Encounter (Signed)
Spoke with the pt He is cancelling the cruise and wants letter stating his condition  Letter printed and up front and pt aware

## 2013-03-27 NOTE — Telephone Encounter (Signed)
Patient wife calling stating they were to go on a cruise, but are not unable to go.  Requesting letter.  [phone]

## 2013-03-27 NOTE — Telephone Encounter (Signed)
Attempted to call x1 LMTCB 

## 2013-03-28 NOTE — Telephone Encounter (Signed)
lmtcb x2 

## 2013-03-28 NOTE — Telephone Encounter (Signed)
Would follow Dr Agustina Caroli recs on how to take the prednisone since he's the last one to see him even after he starts the OFEV  Fine to refer to [facility] pulmonary fibrosis clinic and ok to change to their recs and do f/u there or just get a second opinion there and return here but whoever is going to assume his care needs to make the call on all his meds/ 02 etc.

## 2013-03-28 NOTE — Telephone Encounter (Signed)
LMTCB

## 2013-03-28 NOTE — Telephone Encounter (Signed)
Pt's spouse is returning call & can be reached at [phone].  Satira Anis

## 2013-03-28 NOTE — Telephone Encounter (Signed)
Spoke with the pt's spouse  She states Esbriet about the come in the mail  Pt would like to know if he is continue the prednisone along with new med  Also, he is requesting referral to [facility] for a second opinion  Please advise thanks!

## 2013-03-29 NOTE — Telephone Encounter (Addendum)
Pt's wife is aware of MW recs. They want to wait to start Sun until he is seen at [facility]. Order has been placed for referral to [facility]. Nothing further is needed at this time.

## 2013-04-01 ENCOUNTER — Telehealth: Payer: Self-pay | Admitting: Internal Medicine

## 2013-04-01 ENCOUNTER — Encounter: Payer: Self-pay | Admitting: *Deleted

## 2013-04-01 NOTE — Telephone Encounter (Signed)
Spoke with spouse  Letter to cancel cruise trip needs to state "pt not fir to travel" Letter done and ready for pick up  She is aware and nothing further needed

## 2013-04-05 ENCOUNTER — Telehealth: Payer: Self-pay | Admitting: Internal Medicine

## 2013-04-05 DIAGNOSIS — J9610 Chronic respiratory failure, unspecified whether with hypoxia or hypercapnia: Secondary | ICD-10-CM

## 2013-04-05 NOTE — Telephone Encounter (Signed)
I have not received anything  LMTCB for Bernard Griffith

## 2013-04-08 NOTE — Telephone Encounter (Signed)
I received the results and have placed in [place] for review  Please advise thanks

## 2013-04-08 NOTE — Telephone Encounter (Signed)
Per MW- order POC with increased liter flow  Order was sent to [facility] for this

## 2013-04-08 NOTE — Telephone Encounter (Signed)
I spoke with Bernard Griffith and she is re faxing these results. I will send message to Bernard Griffith to keep an eye out for these. [place] Bing, CMA

## 2013-04-27 ENCOUNTER — Telehealth: Payer: Self-pay | Admitting: Pulmonary Disease

## 2013-04-27 NOTE — Telephone Encounter (Signed)
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  He is on home oxygen.  He recently completed burst and taper of prednisone.  Since prednisone stopped he is more SOB and more hypoxic.  Home pulse ox shows 85-92%.  Patient on 4 liters of oxygen usually but now on 5 liters.  No fever or chills.  Just SOB.  He has f/u appt on Tuesday March 24.  Prednisone 40 mg daily prescribed.  I recommended reporting to ED if any worsening at all.  Patient understands.  He is to call office Monday morning.

## 2013-04-29 ENCOUNTER — Encounter: Payer: Self-pay | Admitting: Internal Medicine

## 2013-04-29 ENCOUNTER — Telehealth: Payer: Self-pay | Admitting: Internal Medicine

## 2013-04-29 ENCOUNTER — Ambulatory Visit (INDEPENDENT_AMBULATORY_CARE_PROVIDER_SITE_OTHER): Payer: Medicare Other | Admitting: Internal Medicine

## 2013-04-29 VITALS — BP 126/62 | HR 93 | Temp 97.7°F | Ht 68.0 in | Wt 180.0 lb

## 2013-04-29 DIAGNOSIS — J8410 Pulmonary fibrosis, unspecified: Secondary | ICD-10-CM

## 2013-04-29 DIAGNOSIS — J9610 Chronic respiratory failure, unspecified whether with hypoxia or hypercapnia: Secondary | ICD-10-CM

## 2013-04-29 MED ORDER — PANTOPRAZOLE SODIUM 40 MG PO TBEC: 40.0000 mg | DELAYED_RELEASE_TABLET | Freq: Every day | ORAL | Status: DC

## 2013-04-29 MED ORDER — NINTEDANIB ESYLATE 150 MG PO CAPS: ORAL_CAPSULE | ORAL | Status: DC

## 2013-04-29 NOTE — Assessment & Plan Note (Signed)
-   02/04/2013  Walked RA x 1 laps @ 185 ft each stopped due to desat 86 without sob - 01/15/2013 PFT's  VC 2.62 (65%) s obst and DLCO 27 corrects to 50%  - HRCT 02/13/2013 > 1. Pulmonary parenchymal pattern of pulmonary fibrosis is most consistent with usual interstitial pneumonitis. 2. Borderline mediastinal adenopathy can be seen in the setting of interstitial lung disease. 3. Enlarged pulmonary arteries, indicative of pulmonary arterial Hypertension. - Collagen vasc screen 03/05/13   ESR20/ neg ana, RA and CCP > rec rx 2 weeks prednisone then return to consider OFEV > no better 03/19/13   - Chronic  Steroid rx started 03/25/13 - OFEV started 04/29/2013   Discussed in detail all the  indications, usual  risks and alternatives  relative to the benefits with patient who agrees to proceed with rx with OFEV.  He also appears to have a steroid resp component > The goal with a chronic steroid dependent illness is always arriving at the lowest effective dose that controls the disease/symptoms and not accepting a set "formula" which is based on statistics or guidelines that don't always take into account patient  variability or the natural hx of the dz in every individual patient, which may well vary over time.  For now therefore I recommend the patient maintain  A ceiling of 40 and a floor of 10 mg daily

## 2013-04-29 NOTE — Progress Notes (Signed)
Subjective:    Patient ID: Bernard Griffith, male   DOB: 25-Sep-1940   MRN: [id number]  Brief patient profile:  58 yowm quit smoking in 1962 with dx pna around 2011 and doe ever since eval by cardiology with "ok heart" and referred 02/04/2013 to pulmonary clinic by Dr Donnie Coffin with ? PF.  History of Present Illness  02/04/2013 1st [facility] office visit/ [name] cc new onset doe x 3 years abrupt onset dx as pna and progressively worse to point where  can barely get across the parking lot, with freq throat clearing. No better on saba  H/o prostate ca but no systemic chemo/ no h/o Rheumatism rec Please see patient coordinator before you leave today  to schedule High resolution CT chest Pantoprazole (protonix) 40 mg Take 30-60 min before first meal of the day and Pepcid 20 mg one bedtime until return to office - this is the best way to tell whether stomach acid is contributing to your problem.  GERD (REFLUX)   03/05/2013 f/u ov/Daiquan Resnik re: cough/ PF Chief Complaint  Patient presents with  . Follow-up    Pt c/o increased SOB with exertion, occasional nonprod cough  rec You have a condition called pulmonary fibrosis that means scarring in the part of your lungs where oxygen is absorbed as is making you short of breath. The cause is unclear = Idiopathic pulmonary fibrosis Take Prednisone 4 for three days, 3 for three days, 2 for three days, 1 for three days and stop  Please remember to go to the lab  Department> neg for collagen vasc profile   03/19/13 -  f/u ov/[name] re: PF no better p pred rx maint on gerd rx  Chief Complaint  Patient presents with  . 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 A steroid taper was given 1/27 without response. His ANA and RF are negative. He is hypoxemic, started O2 3 days prior to OV  . He feels no better, in fact is more SOB w exertion. He has cancelled his cruise. rec Please increase oxygen to 3L/min at all times. Our goal is for your Oxygen saturation level to be over 90% Take prednisone 40mg  daily for the next 4 weeks Hold ofev for now (never started)   04/29/2013 f/u ov/[name] re: hypoxemic respiratory failure/ UIP Chief Complaint  Patient presents with  . Acute Visit    Pt states ran out of prednisone 6 days ago and now co's increased DOE and fatigue.   at his best much more active but still required amb 02 but not at rest, but off steroids sats borderline/doe even on 02 rx which he titrates as high as 4lpm  No obvious day to day or daytime variabilty or assoc chronic cough or cp or chest tightness, subjective wheeze overt sinus or hb symptoms. No unusual exp hx or h/o childhood pna/ asthma or knowledge of premature birth.  Sleeping ok without nocturnal  or early am exacerbation  of respiratory  c/o's or need  for noct saba. Also denies any obvious fluctuation of symptoms with weather or environmental changes or other aggravating or alleviating factors except as outlined above   Current Medications, Allergies, Complete Past Medical History, Past Surgical History, Family History, and Social History were reviewed in [employer] record.  ROS  The following are not active complaints unless bolded sore throat, dysphagia, dental problems, itching, sneezing,  nasal congestion or excess/ purulent secretions, ear ache,   fever, chills, sweats, unintended wt loss, pleuritic or exertional cp, hemoptysis,  orthopnea pnd or leg swelling, presyncope, palpitations, heartburn, abdominal pain, anorexia, nausea, vomiting,  diarrhea  or change in bowel or urinary habits, change in stools or urine, dysuria,hematuria,  rash, arthralgias, visual complaints, headache, numbness weakness or ataxia or problems with walking or coordination,  change in mood/affect or memory.         Objective:   Physical Exam   Wt Readings from Last 3 Encounters:  04/29/13 180 lb (81.647 kg)  03/25/13 170 lb (77.111 kg)  03/19/13 175 lb 12.8 oz (79.742 kg)      HEENT: nl dentition, turbinates, and orophanx. Nl external ear canals without cough reflex   NECK :  without JVD/Nodes/TM/ nl carotid upstrokes bilaterally   LUNGS: no acc muscle use, bilateral insp crackles/ BV changes  CV:  RRR  no s3 or murmur or increase in P2, no edema   ABD:  soft and nontender with nl excursion in the supine position. No bruits or organomegaly, bowel sounds nl  MS:  warm without deformities, calf tenderness, cyanosis or clubbing  SKIN: warm and dry without lesions    NEURO:  alert, approp, no deficits         Assessment:

## 2013-04-29 NOTE — Patient Instructions (Addendum)
Start OFEV 150 mg twice daily if diarrhea just take once daily   Prednisone 40 mg daily until better then 20 mg daily x 2 weeks then 10 mg daily and hold there until office visit or adjusted by doctor  Adjust 02 to keep saturations over 90%  GERD (REFLUX)  is an extremely common cause of respiratory symptoms, many times with no significant heartburn at all.    It can be treated with medication, but also with lifestyle changes including avoidance of late meals, excessive alcohol, smoking cessation, and avoid fatty foods, chocolate, peppermint, colas, red wine, and acidic juices such as orange juice.  NO MINT OR MENTHOL PRODUCTS SO NO COUGH DROPS  USE SUGARLESS CANDY INSTEAD (sugarless candy, jolley ranchers or Stover's)  NO OIL BASED VITAMINS - use powdered substitutes.    Please schedule a follow up office visit in 6 weeks, sooner if needed

## 2013-04-29 NOTE — Assessment & Plan Note (Signed)
-    02/04/2013   Walked RA x one lap @ 185 stopped due to  desat to 86% s symptoms - started on 02 chronically 24/7 on 03/19/13   Ok to self titrate for sat > 90% as of 04/29/2013   Adequate control on present rx, reviewed > no change in rx needed

## 2013-04-30 ENCOUNTER — Ambulatory Visit: Payer: Medicare Other | Admitting: Internal Medicine

## 2013-05-01 NOTE — Telephone Encounter (Signed)
Pt's wife called back.  They will be here by 10:15 to pick up disk.

## 2013-05-01 NOTE — Telephone Encounter (Signed)
Patient came to pick-up disc and was sent to medical records to get copy of scan.  Nothing further needed at this time

## 2013-05-03 ENCOUNTER — Other Ambulatory Visit: Payer: Self-pay | Admitting: Internal Medicine

## 2013-05-06 ENCOUNTER — Encounter: Payer: Self-pay | Admitting: Internal Medicine

## 2013-08-19 ENCOUNTER — Telehealth (HOSPITAL_COMMUNITY): Payer: Self-pay

## 2013-11-18 ENCOUNTER — Telehealth: Payer: Self-pay | Admitting: Internal Medicine

## 2013-11-19 NOTE — Telephone Encounter (Signed)
Pt is requesting to switch physicians from Dr Melvyn Novas to Dr Lake Bells.  Please advise Dr Melvyn Novas if you are okay with this switch. Thanks.

## 2013-11-19 NOTE — Telephone Encounter (Signed)
appt set with BQ for 11-25-13.

## 2013-11-19 NOTE — Telephone Encounter (Signed)
Not true at all re [facility] but I do think he should  stick with one provider and ok with me if that's either [facility] or Dr Lake Bells

## 2013-11-25 ENCOUNTER — Ambulatory Visit (INDEPENDENT_AMBULATORY_CARE_PROVIDER_SITE_OTHER): Payer: Medicare Other | Admitting: Pulmonary Disease

## 2013-11-25 ENCOUNTER — Encounter: Payer: Self-pay | Admitting: Pulmonary Disease

## 2013-11-25 VITALS — BP 118/72 | HR 82 | Ht 68.0 in | Wt 178.0 lb

## 2013-11-25 DIAGNOSIS — J84112 Idiopathic pulmonary fibrosis: Secondary | ICD-10-CM

## 2013-11-25 DIAGNOSIS — R197 Diarrhea, unspecified: Secondary | ICD-10-CM | POA: Insufficient documentation

## 2013-11-25 NOTE — Assessment & Plan Note (Signed)
I agree with my colleague Dr. Wynn Maudlin at 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 However, it is clear that he wants to seek whatever opportunity he has for a lung transplant. He is currently considering having [facility] consider him for lung transplantation. I suspect that they'll have similar hesitancy use as the [employer] group dated (age, coronary disease, prostate cancer, reflux).  He understands at this point the only treatment option he has is Ofev which only slowed the progression of the severe disease. He clearly has severe symptoms as he uses 15-25 L of oxygen with exertion.  Plan: -Continue Ofev -I will support him however needed for the [facility] lung transplant evaluation -Continue pulmonary rehabilitation -If clinical trials for idiopathic pulmonary fibrosis become available soon I will be sure to let him know -Followup 2 months

## 2013-11-25 NOTE — Patient Instructions (Signed)
Keep taking your medicines as you are doing Follow the [place] will see you back in 2 months or sooner if needed

## 2013-11-25 NOTE — Assessment & Plan Note (Signed)
This is related to his Ofev.  Plan: -BRAT diet -Continue Imodium

## 2013-11-25 NOTE — Progress Notes (Signed)
Subjective:    Patient ID: Bernard Griffith, male    DOB: 1940/07/08, 73 y.o.   MRN: [id number]  Synopsis: Idiopathic pulmonary fibrosis followed by Dr. Wynn Maudlin at [facility]. Has been taking Ofev since March 2015. Was turned down for lung transplant by the [facility] transplant team do to age, coronary disease, prostate cancer, and reflux  HPI  Bernard Griffith is here to establish care with me for his IPF.  He has previously followed with Dr. Melvyn Novas here as well as Dr. Wynn Maudlin at [facility].    He has been on Ofev for his IPF.  He has not had too much trouble with the Ofev until recently when he stated to have some diarrhea.  He started taking some immodium which is helping.  He is now haivng diarrhea off an on but the immodium is helping. Greasy foods makes it worse.  He says that his dyspnea has been pretty rough.  He is going to pulmonary rehab at [facility] and is working out [employer] consultant with them. When he works out he is using 15 to 25 LPM when he exercises with walking fast.  He typically drops to 86-85% after walking 5 laps as part of his workout.  He walks a total of a mile and rides his bikd 3.17miles.  He also does leg strengthening exercises as well.  He has no cough.  No heartburn, no chest pain.  He had trouble with swallowing and has taken prilosec and pepcid at night.    He has been taking the prednisone burst prescribed by Dr. Randol Kern and he doesn't think that it is making his breathing better.  It has increased his appetitie  Past Medical History  Diagnosis Date  . Prostate cancer   . Pulmonary fibrosis   . Hypercholesteremia   . Hypothyroidism      Family History  Problem Relation Age of Onset  . Family history unknown: Yes     History   Social History  . Marital Status: Married    Spouse Name: N/A    Number of Children: N/A  . Years of Education: N/A   Occupational History  . Retired    Social History Main Topics  . Smoking status:  Former Smoker -- 0.50 packs/day for 2 years    Types: Cigarettes    Quit date: 02/08/1960  . Smokeless tobacco: Never Used  . Alcohol Use: No  . Drug Use: No  . Sexual Activity: Not on file   Other Topics Concern  . Not on file   Social History Narrative  . No narrative on file     No Known Allergies   Outpatient Prescriptions Prior to Visit  Medication Sig Dispense Refill  . aspirin 81 MG tablet Take 81 mg by mouth daily.      . brimonidine (ALPHAGAN) 0.15 % ophthalmic solution Place 1 drop into the right eye 2 (two) times daily.      . dorzolamide-timolol (COSOPT) 22.3-6.8 MG/ML ophthalmic solution Place 1 drop into the right eye 2 (two) times daily.      . famotidine (PEPCID) 20 MG tablet One at bedtime  30 tablet  2  . levothyroxine (SYNTHROID, LEVOTHROID) 150 MCG tablet Take 150 mcg by mouth daily before breakfast.      . Multiple Vitamins-Minerals (MULTIVITAL PO) Take 1 tablet by mouth daily.      . Nintedanib Esylate (OFEV) 150 MG CAPS One twice daily with food      .  pantoprazole (PROTONIX) 40 MG tablet TAKE 1 TABLET (40 MG TOTAL) BY MOUTH DAILY. TAKE 30 TO 60 MINUTES BEFORE FIRST MEAL OF THE DAY  30 tablet  11  . predniSONE (DELTASONE) 10 MG tablet Take 40 mg by mouth daily with breakfast.      . rosuvastatin (CRESTOR) 10 MG tablet Take 10 mg by mouth daily.       . tamsulosin (FLOMAX) 0.4 MG CAPS capsule Take 0.4 mg by mouth daily.      . pantoprazole (PROTONIX) 40 MG tablet Take 1 tablet (40 mg total) by mouth daily. Take 30-60 min before first meal of the day       No facility-administered medications prior to visit.      Review of Systems  Constitutional: Negative for fever and unexpected weight change.  HENT: Negative for congestion, dental problem, ear pain, nosebleeds, postnasal drip, rhinorrhea, sinus pressure, sneezing, sore throat and trouble swallowing.   Eyes: Negative for redness and itching.  Respiratory: Positive for shortness of breath. Negative for  cough, chest tightness and wheezing.   Cardiovascular: Negative for palpitations and leg swelling.  Gastrointestinal: Negative for nausea and vomiting.  Genitourinary: Negative for dysuria.  Musculoskeletal: Negative for joint swelling.  Skin: Negative for rash.  Neurological: Negative for headaches.  Hematological: Does not bruise/bleed easily.  Psychiatric/Behavioral: Negative for dysphoric mood. The patient is not nervous/anxious.        Objective:   Physical Exam Filed Vitals:   11/25/13 1559  BP: 118/72  Pulse: 82  Height: 5\' 8"  (1.727 m)  Weight: 178 lb (80.74 kg)  SpO2: 98%   O2 15L on arrival, now back to 8LPM  Gen: well appearing, no acute distress HEENT: NCAT, PERRL, EOMi, OP clear, PULM: Crackles 2/3 way up bilaterally CV: RRR, no mgr, no JVD AB: BS+, soft, nontender,  Ext: warm, no edema, no clubbing, no cyanosis Derm: no rash or skin breakdown Neuro: A&Ox4, CN II-XII intact, strength 5/5 in all 4 extremities      Assessment & Plan:   IPF (idiopathic pulmonary fibrosis) I agree with my colleague Dr. Wynn Maudlin at [facility] that Bernard Griffith has advanced idiopathic pulmonary fibrosis in the best approach at this point is likely to pursue hospice. However, it is clear that he wants to seek whatever opportunity he has for a lung transplant. He is currently considering having [facility] consider him for lung transplantation. I suspect that they'll have similar hesitancy use as the [employer] group dated (age, coronary disease, prostate cancer, reflux).  He understands at this point the only treatment option he has is Ofev which only slowed the progression of the severe disease. He clearly has severe symptoms as he uses 15-25 L of oxygen with exertion.  Plan: -Continue Ofev -I will support him however needed for the [facility] lung transplant evaluation -Continue pulmonary rehabilitation -If clinical trials for idiopathic  pulmonary fibrosis become available soon I will be sure to let him know -Followup 2 months  Diarrhea This is related to his Ofev.  Plan: -BRAT diet -Continue Imodium    Updated Medication List Outpatient Encounter Prescriptions as of 11/25/2013  Medication Sig  . aspirin 81 MG tablet Take 81 mg by mouth daily.  . brimonidine (ALPHAGAN) 0.15 % ophthalmic solution Place 1 drop into the right eye 2 (two) times daily.  . dorzolamide-timolol (COSOPT) 22.3-6.8 MG/ML ophthalmic solution Place 1 drop into the right eye 2 (two) times daily.  . famotidine (PEPCID) 20  MG tablet One at bedtime  . levothyroxine (SYNTHROID, LEVOTHROID) 150 MCG tablet Take 150 mcg by mouth daily before breakfast.  . Multiple Vitamins-Minerals (MULTIVITAL PO) Take 1 tablet by mouth daily.  . Nintedanib Esylate (OFEV) 150 MG CAPS One twice daily with food  . pantoprazole (PROTONIX) 40 MG tablet TAKE 1 TABLET (40 MG TOTAL) BY MOUTH DAILY. TAKE 30 TO 60 MINUTES BEFORE FIRST MEAL OF THE DAY  . predniSONE (DELTASONE) 10 MG tablet Take 40 mg by mouth daily with breakfast.  . rosuvastatin (CRESTOR) 10 MG tablet Take 10 mg by mouth daily.   . tamsulosin (FLOMAX) 0.4 MG CAPS capsule Take 0.4 mg by mouth daily.  . [DISCONTINUED] pantoprazole (PROTONIX) 40 MG tablet Take 1 tablet (40 mg total) by mouth daily. Take 30-60 min before first meal of the day

## 2013-12-19 ENCOUNTER — Telehealth: Payer: Self-pay | Admitting: Pulmonary Disease

## 2013-12-19 NOTE — Telephone Encounter (Signed)
Per 04/29/13 OV w/ MW; Prednisone 40 mg daily until better then 20 mg daily x 2 weeks then 10 mg daily and hold there until office visit or adjusted by doctor  Pt switched physicians and now see's BQ. Pt OV with BQ was on 11/25/13. NO mention of how much prednisone pt needs to be taken daily. Please advise thanks

## 2013-12-20 MED ORDER — PREDNISONE 10 MG PO TABS: 10.0000 mg | ORAL_TABLET | Freq: Every day | ORAL | Status: DC

## 2013-12-20 NOTE — Telephone Encounter (Signed)
Yes that is fine to refill prednisone 10mg  dispense #30 x5 refill Tell him to take whatever Dr. Randol Kern at [facility] instructed him to take, if no instructions from [facility] then I want him to take 10mg  daily

## 2013-12-20 NOTE — Telephone Encounter (Signed)
Spoke with the pt and he states that Dr Randol Kern wants him to take 10 mg daily  Med refilled  Nothing further needed

## 2013-12-30 ENCOUNTER — Telehealth: Payer: Self-pay | Admitting: Pulmonary Disease

## 2013-12-30 NOTE — Telephone Encounter (Signed)
LMTC x 1  

## 2013-12-31 NOTE — Telephone Encounter (Signed)
lmtcb for pt.  

## 2014-01-01 NOTE — Telephone Encounter (Signed)
Called and spoke to [place]. Vaughan Basta stated the pt's breathing has improved since 11/23. Pt is only having DOE which is at baseline for him. Pt denies cough, CP/tightness, fever. Vaughan Basta is requesting a type of wheelchair but is unsure of the type it is called and stated it is different from the ordinary wheelchairs. Renato Gails to find out more information of exactly what wheelchair and we can send a message to BQ requesting an order. Vaughan Basta verbalized understanding and stated she will call back with more information.

## 2014-01-03 NOTE — Telephone Encounter (Signed)
Wife brought in paper needs  Fax sent to [phone] Hassie Bruce rx needs pt's first and last name Doc name and signature [id number] code  rx has to say wheelchair [place]   Store# [phone] ext [place] number is [phone] Vaughan Basta

## 2014-01-03 NOTE — Telephone Encounter (Signed)
Sure fine by me

## 2014-01-03 NOTE — Telephone Encounter (Signed)
Dr. Lake Bells are you ok with writing this order?  Thanks!

## 2014-01-06 NOTE — Telephone Encounter (Signed)
This has been faxed, order placed in scan folder.  Nothing further needed.

## 2014-01-06 NOTE — Telephone Encounter (Signed)
Rx written, to be given to BQ this afternoon in office. Caryl Pina has Rx for wheelchair.  Rx needs to be faxed to [phone]

## 2014-01-08 ENCOUNTER — Telehealth: Payer: Self-pay | Admitting: Internal Medicine

## 2014-01-08 NOTE — Telephone Encounter (Signed)
I spoke with Hassie Bruce at [facility] and she states she received an order for a wheelchair for this pt but she needs demographics and insurance information faxed to her as well. Fax# is [phone] to her attention. Thanks. [place] Bing, CMA

## 2014-01-09 NOTE — Telephone Encounter (Signed)
Message sent to [facility] [email or web address]   to take care of this Bernard Griffith

## 2014-01-27 ENCOUNTER — Ambulatory Visit (INDEPENDENT_AMBULATORY_CARE_PROVIDER_SITE_OTHER): Payer: Medicare Other | Admitting: Pulmonary Disease

## 2014-01-27 ENCOUNTER — Encounter: Payer: Self-pay | Admitting: Pulmonary Disease

## 2014-01-27 VITALS — BP 126/68 | HR 87 | Ht 68.0 in | Wt 169.0 lb

## 2014-01-27 DIAGNOSIS — J9611 Chronic respiratory failure with hypoxia: Secondary | ICD-10-CM

## 2014-01-27 DIAGNOSIS — J8410 Pulmonary fibrosis, unspecified: Secondary | ICD-10-CM

## 2014-01-27 DIAGNOSIS — J84112 Idiopathic pulmonary fibrosis: Secondary | ICD-10-CM

## 2014-01-27 MED ORDER — FAMOTIDINE 20 MG PO TABS: ORAL_TABLET | ORAL | Status: DC

## 2014-01-27 NOTE — Progress Notes (Signed)
Subjective:    Patient ID: Bernard Griffith, male    DOB: [birth year], 73 y.o.   MRN: [id number]  Synopsis: Idiopathic pulmonary fibrosis followed by Dr. Wynn Maudlin at [facility]. Has been taking Ofev since March 2015. Was turned down for lung transplant by the [facility] transplant team do to age, coronary disease, prostate cancer, and reflux  HPI  Chief Complaint  Patient presents with  . Follow-up    Pt c/o sob with exertion.  no other breathing complaints at this time. Pt tolerating ofev well.     Bernard Griffith says that his breathing is getting worse.  He has a cataract that he plans to have removed next week.  He said that [facility] turned him down for lung transplant.  He has changed his insurance specifically so that some other hospitals will take a look.  He continues to have cough, but not too bad.  Sometimes he coughs more when he is more short of breath.  No new leg swelling, no fever, no chills.  No pain, no chest congestion  He continues to have te sensation that soemthing is stuck in his troat.  Marland Kitchen  He continues to take Ofev.    Past Medical History  Diagnosis Date  . Prostate cancer   . Pulmonary fibrosis   . Hypercholesteremia   . Hypothyroidism      Review of Systems  Constitutional: Negative for fever and chills.  HENT: Negative for postnasal drip, rhinorrhea and sinus pressure.   Respiratory: Positive for cough and shortness of breath. Negative for choking and wheezing.   Cardiovascular: Negative for chest pain, palpitations and leg swelling.       Objective:   Physical Exam  Filed Vitals:   01/27/14 1343  BP: 126/68  Pulse: 87  Height: 5\' 8"  (1.727 m)  Weight: 169 lb (76.658 kg)  SpO2: 91%   8L   Gen: chronically ill appearing, no acute distress HEENT: NCAT, PERRL, EOMi, OP clear, neck supple without masses PULM: Crackles throughout CV: RRR, no mgr, no JVD AB: BS+, soft, nontender, Ext: warm, no edema, no clubbing, no  cyanosis Derm: no rash or skin breakdown Neuro: A&Ox4, MAEW       Assessment & Plan:   IPF (idiopathic pulmonary fibrosis) Bernard Griffith has severe shortness of breath related to his advanced idiopathic pulmonary fibrosis which is end-stage. He continues to tolerate the Ofev without major symptoms, but he understands that this is not curative therapy. He continues to seek possibilities for lung transplantation, but I do not believe that's ever going to be a possibility with his age, history of coronary disease, reflux, and prostate cancer.  He is having some globus sensation in his throat which I think is directly related to his reflux. This is likely worse because he recently stopped his proton pump inhibitor. I explained to him that this will cause rapid deterioration in his pulmonary fibrosis and was a reason for his rejection for lung transplant.  Plan: -Take Pepcid twice a day rather than once a day for reflux -He is going to discuss lung transplant possibilities with [facility] and a facility in [place] when his insurance changes in January 2016. -continue Ofev for now, I will look for clinical trials for him for pulmonary fibrosis -I plan for him to come back in about 4-6 weeks and we will discuss hospice at that point, I suspect he will have been turned down for lung transplant by the facilities in [place] and  [place] at that point.   Chronic respiratory failure 8 L at rest, greater than 25 L on exertion, he will continue to titrate to maintain O2 saturation greater than 90%.    Updated Medication List Outpatient Encounter Prescriptions as of 01/27/2014  Medication Sig  . aspirin 81 MG tablet Take 81 mg by mouth daily.  . brimonidine (ALPHAGAN) 0.15 % ophthalmic solution Place 1 drop into the right eye 2 (two) times daily.  . dorzolamide-timolol (COSOPT) 22.3-6.8 MG/ML ophthalmic solution Place 1 drop into the right eye 2 (two) times daily.  . famotidine (PEPCID) 20 MG tablet One at  bedtime  . levothyroxine (SYNTHROID, LEVOTHROID) 150 MCG tablet Take 150 mcg by mouth daily before breakfast.  . Multiple Vitamins-Minerals (MULTIVITAL PO) Take 1 tablet by mouth daily.  . Nintedanib Esylate (OFEV) 150 MG CAPS One twice daily with food  . predniSONE (DELTASONE) 10 MG tablet Take 1 tablet (10 mg total) by mouth daily with breakfast.  . rosuvastatin (CRESTOR) 10 MG tablet Take 10 mg by mouth daily.   . tamsulosin (FLOMAX) 0.4 MG CAPS capsule Take 0.4 mg by mouth daily.  . [DISCONTINUED] pantoprazole (PROTONIX) 40 MG tablet TAKE 1 TABLET (40 MG TOTAL) BY MOUTH DAILY. TAKE 30 TO 60 MINUTES BEFORE FIRST MEAL OF THE DAY (Patient not taking: Reported on 01/27/2014)

## 2014-01-27 NOTE — Patient Instructions (Signed)
We will have our research coordinator contact you about Clinical trials Please let us know what you hear from the transplant centers Take pepcid twice per day Exercise regularly We will see you back in about 6 weeks or sooner if needed

## 2014-01-28 NOTE — Assessment & Plan Note (Signed)
8 L at rest, greater than 25 L on exertion, he will continue to titrate to maintain O2 saturation greater than 90%.

## 2014-01-28 NOTE — Assessment & Plan Note (Addendum)
Oneill has severe shortness of breath related to his advanced idiopathic pulmonary fibrosis which is end-stage. He continues to tolerate the Ofev without major symptoms, but he understands that this is not curative therapy. He continues to seek possibilities for lung transplantation, but I do not believe that's ever going to be a possibility with his age, history of coronary disease, reflux, and prostate cancer.  He is having some globus sensation in his throat which I think is directly related to his reflux. This is likely worse because he recently stopped his proton pump inhibitor. I explained to him that this will cause rapid deterioration in his pulmonary fibrosis and was a reason for his rejection for lung transplant.  Plan: -Take Pepcid twice a day rather than once a day for reflux -He is going to discuss lung transplant possibilities with [facility] and a facility in [place] when his insurance changes in January 2016. -continue Ofev for now, I will look for clinical trials for him for pulmonary fibrosis -I plan for him to come back in about 4-6 weeks and we will discuss hospice at that point, I suspect he will have been turned down for lung transplant by the facilities in [place] and [place] at that point.

## 2014-02-03 ENCOUNTER — Telehealth: Payer: Self-pay | Admitting: Pulmonary Disease

## 2014-02-03 ENCOUNTER — Other Ambulatory Visit: Payer: Self-pay

## 2014-02-03 MED ORDER — NINTEDANIB ESYLATE 150 MG PO CAPS: ORAL_CAPSULE | ORAL | Status: DC

## 2014-02-03 NOTE — Telephone Encounter (Signed)
Pt is taking 2 imodium regular strength tablets in the morning and a second tablet at lunch. This has been going on X2-3 days with no change in any medications or diet. Advised pt to take his imodium twice daily per RA's recs below, and reminded pt of the BRAT diet that was discussed with him at the start of his Ofev. As far as ofev dosage changes being left to BQ, let pt know that bq was out of town this week but would address this as soon as he gets back.  Advised pt to contact us if the BRAT diet does not help with his watery stools in the meantime.    Forwarding to BQ, pt aware that he is on vacation this week.

## 2014-02-03 NOTE — Telephone Encounter (Signed)
Called and spoke to pt's wife. Pt stated he is still having watery stools 2-3 times per day despite taking immodium. Pt denies any change in SOB or cough. Pt denies abdominal pain, CP/tightness, f/c/s or swelling. BQ is unavailable this week. Will send to doc-of-day. Pt is taking ofev.    RA please advise.   No Known Allergies

## 2014-02-03 NOTE — Telephone Encounter (Signed)
How much imodium is he taking? Increase dose or take twice daily If still not better, will have to drop dose of ofev - defer to BQ

## 2014-02-10 NOTE — Telephone Encounter (Signed)
Please call to check to see if it is still a problem.  If so then I can decrease the med to 1/2 dose.  Let him know we'll get back to him after 3pm

## 2014-02-11 NOTE — Telephone Encounter (Signed)
Spoke with pt's wife, states pt is doing well.  Nothing further needed.

## 2014-02-12 ENCOUNTER — Telehealth: Payer: Self-pay | Admitting: Pulmonary Disease

## 2014-02-12 DIAGNOSIS — J84112 Idiopathic pulmonary fibrosis: Secondary | ICD-10-CM

## 2014-02-12 NOTE — Telephone Encounter (Signed)
OK by me 

## 2014-02-12 NOTE — Telephone Encounter (Signed)
Called and spoke to Dixon at [facility]. Pt has changed insurance companies and the new insurance is not in network with [facility] and is in network with [facility]. [facility] has already sent pt's information to [facility].   BQ please advise if ok to send order to switch DME from [facility] to [facility].

## 2014-02-13 NOTE — Telephone Encounter (Signed)
Order placed. Bernard Griffith is aware. Nothing further needed.

## 2014-02-14 ENCOUNTER — Telehealth: Payer: Self-pay | Admitting: Pulmonary Disease

## 2014-02-14 NOTE — Telephone Encounter (Signed)
Melissa calling back.  [phone]

## 2014-02-14 NOTE — Telephone Encounter (Signed)
Spoke with Melissa, states that the respiratory management team at [facility] does not feel comfortable managing the pt's high liter flow for his 02.  This order would require 2 tanks with 2 separate cannulas, which they do not feel comfortable with.  I have been given the number of the respiratory therapist/manager of [place], Ethlyn Daniels: [phone].  Spoke with Sheran Spine, he states that [facility] has no safe way of administering 25L with exertion.  His concerns are  1: how much exertion is this patient actually doing? 2: No way to accomodate 25L or greater with exertion- would have to use multiple tanks, which causes a concern for backflow and how much 02 pt is truly receiving.    Sheran Spine states that he is open to suggestions on how to safely administer this much 02 to pt but they do not have a way at this time to safely do so.  Dr Lake Bells please advise.  Thank you.

## 2014-02-14 NOTE — Telephone Encounter (Signed)
lmtcb for pt. Please see phone message from 02/12/14.

## 2014-02-14 NOTE — Telephone Encounter (Signed)
Spoke with Vaughan Basta [facility] on file for patient and caller) she is aware that [facility] called our office yesterday and we changed DME's yesterday for the patient. He will now be with [facility]. Order was confirmed by Lenna Sciara at 11:44am on 02-13-14. I have sent message to [facility] with [facility] today asking she call me today to confirm order and when patient will hear from them regarding set up.  Below is the message I sent to [facility] with [facility]:  Melissa,   Please call me at the office about this patient today. I need to make sure you all have the order from changing DME's  ([facility] to [facility]) per insurance changes as well as when he can be set up with you all. Also, the wife states the home number is not working at this time and needs to be called by [facility] on her mobile number at [phone].   Thanks,  Blair Hailey [phone]  Will hold in triage for response from [facility].

## 2014-02-14 NOTE — Telephone Encounter (Signed)
Returning [name] ° °

## 2014-02-20 NOTE — Telephone Encounter (Signed)
Dr. McQuaid please advise. Thanks! 

## 2014-02-21 NOTE — Telephone Encounter (Signed)
Spoke with the [place] and notified of recs per BQ She verbalized understanding  This has already been handled  Nothing further needed

## 2014-02-21 NOTE — Telephone Encounter (Signed)
If I remember correctly I already responded to this.  Basically the folks from [facility] need to go out to his house and meet him to see his situation before they write him off.  He has advanced lung disease and is exercising daily to try to improve his chances for a lung transplant.  He has been on this high level of oxygen for quite some time (well before I saw him).    Advise that they go out to see him.  At this point I feel he should be prescribed sufficient oxygen to keep his O2 saturation > 88% with exertion.

## 2014-02-24 ENCOUNTER — Ambulatory Visit (INDEPENDENT_AMBULATORY_CARE_PROVIDER_SITE_OTHER): Payer: PPO | Admitting: Pulmonary Disease

## 2014-02-24 ENCOUNTER — Encounter: Payer: Self-pay | Admitting: Pulmonary Disease

## 2014-02-24 VITALS — BP 122/66 | HR 82 | Ht 68.0 in | Wt 163.0 lb

## 2014-02-24 DIAGNOSIS — J9611 Chronic respiratory failure with hypoxia: Secondary | ICD-10-CM

## 2014-02-24 DIAGNOSIS — J84112 Idiopathic pulmonary fibrosis: Secondary | ICD-10-CM

## 2014-02-24 NOTE — Progress Notes (Signed)
Subjective:    Patient ID: Bernard Griffith, male    DOB: 1940-03-07, 74 y.o.   MRN: [id number]  Synopsis: Idiopathic pulmonary fibrosis followed by Dr. Wynn Maudlin at [facility]. Has been taking Ofev since March 2015. Was turned down for lung transplant by the [facility] transplant team do to age, coronary disease, prostate cancer, and reflux  HPI  Chief Complaint  Patient presents with  . Follow-up    Pt states his breathing is unchanged since last visit . pt still having GI issues with the Ofev.    Deng tried to switch his oxygen to [employer] but they ended up staying with [facility].  They have not been to [facility] or [place] yet.  They have actually contacted up to 15 different places for lung transplant.  They are waiting to hear [facility] [place].  He continues to take the Ofev, but he only has a month left on the financial incentive.  He has been fatigued recently but his symptoms have not changed significantly. He does not have a significant cough. He has significant shortness of breath on exertion however. He continues to use and benefit from oxygen on a regular basis. He uses a liters at rest and 25 L when he exerts himself.  Past Medical History  Diagnosis Date  . Prostate cancer   . Pulmonary fibrosis   . Hypercholesteremia   . Hypothyroidism      Review of Systems  Constitutional: Negative for fever and chills.  HENT: Negative for postnasal drip, rhinorrhea and sinus pressure.   Respiratory: Positive for cough and shortness of breath. Negative for choking and wheezing.   Cardiovascular: Negative for chest pain, palpitations and leg swelling.       Objective:   Physical Exam  Filed Vitals:   02/24/14 1444  BP: 122/66  Pulse: 82  Height: 5\' 8"  (1.727 m)  Weight: 163 lb (73.936 kg)  SpO2: 90%   8L   Gen: chronically ill appearing, no acute distress HEENT: NCAT, PERRL, EOMi,  OP clear, neck supple without masses PULM: Crackles throughout CV: RRR, no mgr, no JVD AB: BS+, soft, nontender, Ext: warm, no edema, no clubbing, no cyanosis Derm: no rash or skin breakdown Neuro: A&Ox4, MAEW       Assessment & Plan:   IPF (idiopathic pulmonary fibrosis) Alquan has severe symptoms from his idiopathic pulmonary fibrosis. He is desperate to obtain a lung transplant but I explained to he and his family today that I think the chances of this are quite unlikely considering his prostate cancer, advanced age, and significant gastroesophageal reflux disease. However, they prefer to press on to try to find a center which will transplant him.  They are also interested in participation in a clinical trial. I explained to him that currently at our time our center does not offer critical trials of investigational agents for the treatment of idiopathic pulmonary fibrosis for patients who are taking Ofev.  I will ask the representatives from the [place] if they have clinical trials with investigational agents right now. Otherwise if there are no investigational agents we will consider changing him to profound density he can participate in clinical trials to our center.  Further, I explained to he and his family today (his wife and daughter were both here) that this is a disease that man cannot cure. Specifically I told him that if he cannot get a lung transplant he will die  from this illness. I advised that he needs to start considering how he wants to use his time. While I will continue to support him however necessary as he seeks a lung transplant and participation in clinical trials, I advised that he start thinking about his quality of life primarily. I also advised that I would refer him to hospice if he was so inclined. At this point in time they prefer to continue to seek opportunities for treatment.  Plan: -They will continue to keep me up-to-date in terms of his search for a lung  transplant -Continue Ofev -I will look for clinical trials for patients who are taking Ofev -If no clinical trials are available then we may consider switching to pifenidone -f/ 4-6 weeks   Chronic respiratory failure Severe hypoxemia.  He will continue using 8 L at rest and 25 with exercise   > 25 minutes spent in direct consultation with family and patient today  Updated Medication List Outpatient Encounter Prescriptions as of 02/24/2014  Medication Sig  . aspirin 81 MG tablet Take 81 mg by mouth daily.  . brimonidine (ALPHAGAN) 0.15 % ophthalmic solution Place 1 drop into the right eye 2 (two) times daily.  . dorzolamide-timolol (COSOPT) 22.3-6.8 MG/ML ophthalmic solution Place 1 drop into the right eye 2 (two) times daily.  . famotidine (PEPCID) 20 MG tablet One at bedtime  . levothyroxine (SYNTHROID, LEVOTHROID) 150 MCG tablet Take 150 mcg by mouth daily before breakfast.  . Multiple Vitamins-Minerals (MULTIVITAL PO) Take 1 tablet by mouth daily.  . Nintedanib Esylate (OFEV) 150 MG CAPS One twice daily with food  . predniSONE (DELTASONE) 10 MG tablet Take 1 tablet (10 mg total) by mouth daily with breakfast.  . rosuvastatin (CRESTOR) 10 MG tablet Take 10 mg by mouth daily.   . tamsulosin (FLOMAX) 0.4 MG CAPS capsule Take 0.4 mg by mouth daily.

## 2014-02-24 NOTE — Patient Instructions (Signed)
We will call you back if we have heard anything from the Ofev folks about clinical trials We will see you back in 4 weeks or sooner if needed

## 2014-02-24 NOTE — Assessment & Plan Note (Signed)
Severe hypoxemia.  He will continue using 8 L at rest and 25 with exercise

## 2014-02-24 NOTE — Assessment & Plan Note (Signed)
Gerasimos has severe symptoms from his idiopathic pulmonary fibrosis. He is desperate to obtain a lung transplant but I explained to he and his family today that I think the chances of this are quite unlikely considering his prostate cancer, advanced age, and significant gastroesophageal reflux disease. However, they prefer to press on to try to find a center which will transplant him.  They are also interested in participation in a clinical trial. I explained to him that currently at our time our center does not offer critical trials of investigational agents for the treatment of idiopathic pulmonary fibrosis for patients who are taking Ofev.  I will ask the representatives from the [place] if they have clinical trials with investigational agents right now. Otherwise if there are no investigational agents we will consider changing him to profound density he can participate in clinical trials to our center.  Further, I explained to he and his family today (his wife and daughter were both here) that this is a disease that man cannot cure. Specifically I told him that if he cannot get a lung transplant he will die from this illness. I advised that he needs to start considering how he wants to use his time. While I will continue to support him however necessary as he seeks a lung transplant and participation in clinical trials, I advised that he start thinking about his quality of life primarily. I also advised that I would refer him to hospice if he was so inclined. At this point in time they prefer to continue to seek opportunities for treatment.  Plan: -They will continue to keep me up-to-date in terms of his search for a lung transplant -Continue Ofev -I will look for clinical trials for patients who are taking Ofev -If no clinical trials are available then we may consider switching to pifenidone -f/ 4-6 weeks

## 2014-02-25 ENCOUNTER — Telehealth: Payer: Self-pay | Admitting: Pulmonary Disease

## 2014-02-25 NOTE — Telephone Encounter (Signed)
Spoke with Melissa with [facility]-this is mainly an FYI for BQ; wife told the RT tech that they were not aware of any requests/orders to [facility] and did not want them to come out.

## 2014-02-27 NOTE — Telephone Encounter (Signed)
OK 

## 2014-03-06 ENCOUNTER — Telehealth: Payer: Self-pay | Admitting: Pulmonary Disease

## 2014-03-06 NOTE — Telephone Encounter (Signed)
Called spoke with pt. He reports his spouse has already called and spoke w/ a nurse. I advised him if anything further was needed to call back.

## 2014-03-12 ENCOUNTER — Telehealth: Payer: Self-pay | Admitting: Pulmonary Disease

## 2014-03-12 NOTE — Telephone Encounter (Signed)
Can someone in triage go look at this form and see it's due date?  I just filled out one that had to be faxed back by tomorrow, and I will not be back in clinic until Friday.  Pt has seen MW in the past, perhaps he can sign it if it is that time sensitive.  If not, I'll gladly fill and fax this on Friday.   Thank you!

## 2014-03-12 NOTE — Telephone Encounter (Signed)
FYI placed in BQ look at.

## 2014-03-12 NOTE — Telephone Encounter (Signed)
Due Date on form is 03/13/14.  Forms faxed to [facility] office fax # as these can be faxed back to [facility] once completed.  Original copy placed in Dr. Anastasia Pall look at [place] needed. Will send to [facility] for follow up.

## 2014-03-13 NOTE — Telephone Encounter (Signed)
I never received this fax.  Can this be refaxed for me to fill out?  Thank you!

## 2014-03-13 NOTE — Telephone Encounter (Signed)
This has been faxed back to [place] Ashley/McQuaid

## 2014-03-13 NOTE — Telephone Encounter (Signed)
This has been filled out accordingly and faxed to [place].  Nothing further needed.

## 2014-03-14 ENCOUNTER — Telehealth: Payer: Self-pay | Admitting: Pulmonary Disease

## 2014-03-14 MED ORDER — NINTEDANIB ESYLATE 150 MG PO CAPS: ORAL_CAPSULE | ORAL | Status: AC

## 2014-03-14 NOTE — Telephone Encounter (Signed)
[place], states they need a new rx for ofev faxed over to below number.  This has been done.  Nothing further needed.

## 2014-03-21 ENCOUNTER — Telehealth: Payer: Self-pay | Admitting: *Deleted

## 2014-03-21 NOTE — Telephone Encounter (Signed)
Error .Bernard Griffith

## 2014-03-24 ENCOUNTER — Encounter (HOSPITAL_COMMUNITY): Payer: Self-pay | Admitting: *Deleted

## 2014-03-24 ENCOUNTER — Ambulatory Visit: Payer: PPO | Admitting: Pulmonary Disease

## 2014-03-24 ENCOUNTER — Inpatient Hospital Stay (HOSPITAL_COMMUNITY)
Admission: AD | Admit: 2014-03-24 | Discharge: 2014-03-28 | DRG: 196 | Disposition: A | Discharge status: Home Health | Payer: Medicare Other | Source: Ambulatory Visit | Attending: Pulmonary Disease | Admitting: Pulmonary Disease

## 2014-03-24 ENCOUNTER — Telehealth: Payer: Self-pay | Admitting: Pulmonary Disease

## 2014-03-24 ENCOUNTER — Inpatient Hospital Stay (HOSPITAL_COMMUNITY): Payer: Medicare Other

## 2014-03-24 ENCOUNTER — Ambulatory Visit (INDEPENDENT_AMBULATORY_CARE_PROVIDER_SITE_OTHER): Payer: Medicare Other | Admitting: Pulmonary Disease

## 2014-03-24 ENCOUNTER — Encounter: Payer: Self-pay | Admitting: Pulmonary Disease

## 2014-03-24 VITALS — BP 128/72 | HR 98

## 2014-03-24 DIAGNOSIS — E039 Hypothyroidism, unspecified: Secondary | ICD-10-CM | POA: Diagnosis present

## 2014-03-24 DIAGNOSIS — Z8546 Personal history of malignant neoplasm of prostate: Secondary | ICD-10-CM | POA: Diagnosis not present

## 2014-03-24 DIAGNOSIS — E785 Hyperlipidemia, unspecified: Secondary | ICD-10-CM | POA: Diagnosis present

## 2014-03-24 DIAGNOSIS — E780 Pure hypercholesterolemia: Secondary | ICD-10-CM | POA: Diagnosis present

## 2014-03-24 DIAGNOSIS — R0602 Shortness of breath: Secondary | ICD-10-CM | POA: Diagnosis present

## 2014-03-24 DIAGNOSIS — Z7982 Long term (current) use of aspirin: Secondary | ICD-10-CM

## 2014-03-24 DIAGNOSIS — R197 Diarrhea, unspecified: Secondary | ICD-10-CM

## 2014-03-24 DIAGNOSIS — K219 Gastro-esophageal reflux disease without esophagitis: Secondary | ICD-10-CM | POA: Diagnosis present

## 2014-03-24 DIAGNOSIS — J9621 Acute and chronic respiratory failure with hypoxia: Secondary | ICD-10-CM | POA: Diagnosis present

## 2014-03-24 DIAGNOSIS — J84112 Idiopathic pulmonary fibrosis: Secondary | ICD-10-CM

## 2014-03-24 DIAGNOSIS — Z79899 Other long term (current) drug therapy: Secondary | ICD-10-CM

## 2014-03-24 DIAGNOSIS — Z66 Do not resuscitate: Secondary | ICD-10-CM

## 2014-03-24 DIAGNOSIS — Z515 Encounter for palliative care: Secondary | ICD-10-CM

## 2014-03-24 DIAGNOSIS — J9620 Acute and chronic respiratory failure, unspecified whether with hypoxia or hypercapnia: Secondary | ICD-10-CM | POA: Diagnosis present

## 2014-03-24 DIAGNOSIS — F419 Anxiety disorder, unspecified: Secondary | ICD-10-CM | POA: Diagnosis present

## 2014-03-24 DIAGNOSIS — J9600 Acute respiratory failure, unspecified whether with hypoxia or hypercapnia: Secondary | ICD-10-CM

## 2014-03-24 DIAGNOSIS — J9601 Acute respiratory failure with hypoxia: Secondary | ICD-10-CM

## 2014-03-24 DIAGNOSIS — Z7952 Long term (current) use of systemic steroids: Secondary | ICD-10-CM

## 2014-03-24 DIAGNOSIS — N400 Enlarged prostate without lower urinary tract symptoms: Secondary | ICD-10-CM | POA: Diagnosis present

## 2014-03-24 HISTORY — DX: Gastro-esophageal reflux disease without esophagitis: K21.9

## 2014-03-24 LAB — COMPREHENSIVE METABOLIC PANEL
ALT: 55 U/L — ABNORMAL HIGH (ref 0–53)
AST: 35 U/L (ref 0–37)
Albumin: 3.7 g/dL (ref 3.5–5.2)
Alkaline Phosphatase: 57 U/L (ref 39–117)
Anion gap: 6 (ref 5–15)
BILIRUBIN TOTAL: 1.4 mg/dL — AB (ref 0.3–1.2)
BUN: 22 mg/dL (ref 6–23)
CO2: 27 mmol/L (ref 19–32)
Calcium: 9.3 mg/dL (ref 8.4–10.5)
Chloride: 105 mmol/L (ref 96–112)
Creatinine, Ser: 0.88 mg/dL (ref 0.50–1.35)
GFR calc Af Amer: >90 mL/min (ref >90)
GFR, EST NON AFRICAN AMERICAN: 83 mL/min — AB (ref >90)
GLUCOSE: 101 mg/dL — AB (ref 70–99)
POTASSIUM: 4.8 mmol/L (ref 3.5–5.1)
Sodium: 138 mmol/L (ref 135–145)
TOTAL PROTEIN: 6.9 g/dL (ref 6.0–8.3)

## 2014-03-24 LAB — CBC WITH DIFFERENTIAL/PLATELET
BASOS ABS: 0 10*3/uL (ref 0.0–0.1)
Basophils Relative: 0 % (ref 0–1)
Eosinophils Absolute: 0.2 10*3/uL (ref 0.0–0.7)
Eosinophils Relative: 1 % (ref 0–5)
HCT: 45.6 % (ref 39.0–52.0)
Hemoglobin: 15.2 g/dL (ref 13.0–17.0)
LYMPHS ABS: 2.4 10*3/uL (ref 0.7–4.0)
LYMPHS PCT: 14 % (ref 12–46)
MCH: 29.5 pg (ref 26.0–34.0)
MCHC: 33.3 g/dL (ref 30.0–36.0)
MCV: 88.5 fL (ref 78.0–100.0)
Monocytes Absolute: 1.3 10*3/uL — ABNORMAL HIGH (ref 0.1–1.0)
Monocytes Relative: 8 % (ref 3–12)
Neutro Abs: 12.8 10*3/uL — ABNORMAL HIGH (ref 1.7–7.7)
Neutrophils Relative %: 77 % (ref 43–77)
PLATELETS: 330 10*3/uL (ref 150–400)
RBC: 5.15 MIL/uL (ref 4.22–5.81)
RDW: 15.5 % (ref 11.5–15.5)
WBC: 16.7 10*3/uL — ABNORMAL HIGH (ref 4.0–10.5)

## 2014-03-24 LAB — MRSA PCR SCREENING: MRSA BY PCR: NEGATIVE

## 2014-03-24 LAB — STREP PNEUMONIAE URINARY ANTIGEN: Strep Pneumo Urinary Antigen: NEGATIVE

## 2014-03-24 LAB — PROCALCITONIN: Procalcitonin: <0.1 ng/mL

## 2014-03-24 LAB — BRAIN NATRIURETIC PEPTIDE: B Natriuretic Peptide: 1148.4 pg/mL — ABNORMAL HIGH (ref 0.0–100.0)

## 2014-03-24 LAB — TROPONIN I: Troponin I: 0.16 ng/mL — ABNORMAL HIGH (ref <0.031)

## 2014-03-24 MED ORDER — DEXTROSE 5 % IV SOLN
1.0000 g | INTRAVENOUS | Status: DC
  Administered 2014-03-24 – 2014-03-25 (×2): 1 g via INTRAVENOUS
  Filled 2014-03-24 (×2): qty 10

## 2014-03-24 MED ORDER — TAMSULOSIN HCL 0.4 MG PO CAPS
0.4000 mg | ORAL_CAPSULE | Freq: Every day | ORAL | Status: DC
  Administered 2014-03-25 – 2014-03-28 (×4): 0.4 mg via ORAL
  Filled 2014-03-24 (×4): qty 1

## 2014-03-24 MED ORDER — LEVOTHYROXINE SODIUM 75 MCG PO TABS
150.0000 ug | ORAL_TABLET | Freq: Every day | ORAL | Status: DC
  Administered 2014-03-25 – 2014-03-28 (×4): 150 ug via ORAL
  Filled 2014-03-24 (×5): qty 2

## 2014-03-24 MED ORDER — BRIMONIDINE TARTRATE 0.15 % OP SOLN
1.0000 [drp] | Freq: Two times a day (BID) | OPHTHALMIC | Status: DC
  Administered 2014-03-24 – 2014-03-28 (×8): 1 [drp] via OPHTHALMIC
  Filled 2014-03-24: qty 5

## 2014-03-24 MED ORDER — HEPARIN SODIUM (PORCINE) 5000 UNIT/ML IJ SOLN
5000.0000 [IU] | Freq: Three times a day (TID) | INTRAMUSCULAR | Status: DC
  Administered 2014-03-24 – 2014-03-28 (×12): 5000 [IU] via SUBCUTANEOUS
  Filled 2014-03-24 (×12): qty 1

## 2014-03-24 MED ORDER — SODIUM CHLORIDE 0.9 % IV SOLN: 250.0000 mL | INTRAVENOUS | Status: DC | PRN

## 2014-03-24 MED ORDER — VITAMINS A & D EX OINT
TOPICAL_OINTMENT | CUTANEOUS | Status: AC
Start: 2014-03-24 — End: 2014-03-25
  Filled 2014-03-24: qty 5

## 2014-03-24 MED ORDER — PANTOPRAZOLE SODIUM 40 MG IV SOLR
40.0000 mg | Freq: Two times a day (BID) | INTRAVENOUS | Status: DC
  Administered 2014-03-24 – 2014-03-26 (×5): 40 mg via INTRAVENOUS
  Filled 2014-03-24 (×5): qty 40

## 2014-03-24 MED ORDER — ASPIRIN EC 81 MG PO TBEC
81.0000 mg | DELAYED_RELEASE_TABLET | Freq: Every day | ORAL | Status: DC
  Administered 2014-03-24 – 2014-03-28 (×5): 81 mg via ORAL
  Filled 2014-03-24 (×5): qty 1

## 2014-03-24 MED ORDER — DORZOLAMIDE HCL-TIMOLOL MAL 2-0.5 % OP SOLN
1.0000 [drp] | Freq: Two times a day (BID) | OPHTHALMIC | Status: DC
  Administered 2014-03-24 – 2014-03-28 (×8): 1 [drp] via OPHTHALMIC
  Filled 2014-03-24: qty 10

## 2014-03-24 MED ORDER — SODIUM CHLORIDE 0.9 % IV SOLN
INTRAVENOUS | Status: DC
  Administered 2014-03-24: 18:00:00 via INTRAVENOUS

## 2014-03-24 MED ORDER — ONDANSETRON HCL 4 MG/2ML IJ SOLN: 4.0000 mg | Freq: Four times a day (QID) | INTRAMUSCULAR | Status: DC | PRN

## 2014-03-24 MED ORDER — METHYLPREDNISOLONE SODIUM SUCC 40 MG IJ SOLR
40.0000 mg | Freq: Four times a day (QID) | INTRAMUSCULAR | Status: DC
  Administered 2014-03-24 – 2014-03-28 (×15): 40 mg via INTRAVENOUS
  Filled 2014-03-24 (×15): qty 1

## 2014-03-24 MED ORDER — NINTEDANIB ESYLATE 150 MG PO CAPS
150.0000 mg | ORAL_CAPSULE | Freq: Two times a day (BID) | ORAL | Status: DC
  Administered 2014-03-24 – 2014-03-28 (×8): 150 mg via ORAL

## 2014-03-24 MED ORDER — ACETAMINOPHEN 325 MG PO TABS: 650.0000 mg | ORAL_TABLET | ORAL | Status: DC | PRN

## 2014-03-24 MED ORDER — ROSUVASTATIN CALCIUM 10 MG PO TABS
10.0000 mg | ORAL_TABLET | Freq: Every day | ORAL | Status: DC
  Administered 2014-03-25 – 2014-03-28 (×4): 10 mg via ORAL
  Filled 2014-03-24 (×4): qty 1

## 2014-03-24 MED ORDER — SALINE SPRAY 0.65 % NA SOLN
1.0000 | NASAL | Status: DC | PRN
  Administered 2014-03-24 – 2014-03-26 (×3): 1 via NASAL
  Filled 2014-03-24 (×2): qty 44

## 2014-03-24 MED ORDER — DEXTROSE 5 % IV SOLN
500.0000 mg | INTRAVENOUS | Status: DC
  Administered 2014-03-24 – 2014-03-26 (×3): 500 mg via INTRAVENOUS
  Filled 2014-03-24 (×3): qty 500

## 2014-03-24 NOTE — Progress Notes (Signed)
ANTIBIOTIC CONSULT NOTE - INITIAL  Pharmacy Consult for Ceftriaxone Indication: CAP  No Known Allergies  Patient Measurements:   Adjusted Body Weight:   Vital Signs: BP: 127/79 mmHg (02/15 1608) Pulse Rate: 123 (02/15 1608) Intake/Output from previous day:   Intake/Output from this shift:    Labs: No results for input(s): WBC, HGB, PLT, LABCREA, CREATININE in the last 72 hours. CrCl cannot be calculated (Unknown ideal weight.). No results for input(s): VANCOTROUGH, VANCOPEAK, VANCORANDOM, GENTTROUGH, GENTPEAK, GENTRANDOM, TOBRATROUGH, TOBRAPEAK, TOBRARND, AMIKACINPEAK, AMIKACINTROU, AMIKACIN in the last 72 hours.   Microbiology: No results found for this or any previous visit (from the past 720 hour(s)).  Medical History: Past Medical History  Diagnosis Date  . Prostate cancer   . Pulmonary fibrosis   . Hypercholesteremia   . Hypothyroidism    Assessment: 19 yoM with PMHx prostate CA, pulmonary fibrosis, HLD, and hypothyroidism admitted from pulmonary office for increased SOB / acute on chronic hypoxemic respiratory failure possibly secondary to flare of pulmonary fibrosis vs end stage chronic disease.  Pt not transplant candidate. Pharmacy consulted to start ceftriaxone for possible CAP treatment.   NKDA  2/15 >> Azithromycin  >> 2/15 >> Ceftriaxone  >>    No labs/micro data yet  Goal of Therapy:  Eradication of infection  Plan:  Continue ceftriaxone 1g IV q24h Azithromycin dose ok F/u clinical course, labs, microbiology  Ralene Bathe, PharmD, BCPS 03/24/2014, 4:39 PM  Pager: [phone]

## 2014-03-24 NOTE — Assessment & Plan Note (Signed)
This is related to his Ofev. He is not willing to give this up at this time so he will need to continue taking Imodium on an as-needed basis and follow the brat diet.

## 2014-03-24 NOTE — Assessment & Plan Note (Signed)
His acute on chronic hypoxemic respiratory failure is worrisome for a flare of idiopathic pulmonary fibrosis. I explained to him today that if he had fever, purulent cough, or chest pain or leg swelling than I would consider either an infectious or cardiac etiology but I see no evidence of either at this time. However sometimes this can be difficult to sort out so we will get a chest x-ray to look for either one of those possibilities.  I explained to he and his wife at length today in clinic that either this is a flare of his idiopathic pulmonary fibrosis or it is just the end-stage of this chronic disease which we have no cure for. As noted in multiple previous notes he is not a transplant candidate. I do not believe he will live long enough in order to be enrolled in clinical trials.  In the meantime, I have given him one of 2 options. Either go home on hospice now or go to the hospital and be treated for a flare of IPF with Solu-Medrol and then go home on hospice later. Bernard Griffith has been very reluctant to even consider hospice, and continues to be reluctant today. However after lengthy consideration and discussion in clinic he is willing to be admitted and then transition to hospice later.  Plan: -Admit to [facility] long hospital stepdown unit -Solu-Medrol 125 mg IV every 6 hours -CXR now

## 2014-03-24 NOTE — Telephone Encounter (Signed)
Spoke with pt, not sure what wife was calling for, seemed confused when I mentioned Hospice. Asked that pt have his wife contact our office back when she is available.  BQ plan is for patient to be discharged from hospital with Hospice.

## 2014-03-24 NOTE — Progress Notes (Signed)
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

## 2014-03-24 NOTE — H&P (Signed)
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 Has been taking Ofev since March 2015. Was turned down for lung transplant by the [facility] transplant team do to age, coronary disease, prostate cancer, and reflux    HPI   Chief Complaint   Patient presents with   .  Follow-up     pt c/o increased sob with any exertion, has difficulty keeping 02 at appropriate level.    [name] returns to my clinic today quite short of breath. He says that for the last several weeks he has been experiencing worsening dyspnea at home. Just walking from his bedroom to his living room is an exhausting experience and on 2 separate occasions in the last week his wife has come very close to calling 911 because he was doing so poorly. He has been using as much as 25 L of oxygen on a near continuous basis. He states that he rests at home with 2 oxygen concentrators connected into the same nasal cannula, both going at 10 L/m. He has not had an increase in cough, chest pain, or leg swelling at home. He denies mucus production. His biggest problem is that his shortness of breath has just progressed in the last several weeks. He also notes diarrhea at home.    Past Medical History   Diagnosis  Date   .  Prostate cancer    .  Pulmonary fibrosis    .  Hypercholesteremia    .  Hypothyroidism     Review of Systems  Gen: Denies fever, chills, weight change, + fatigue, night sweats HEENT: Denies blurred vision, double vision, hearing loss, tinnitus, sinus congestion, rhinorrhea, sore throat, neck stiffness, dysphagia PULM: per HPI CV: Denies chest pain, edema, orthopnea, paroxysmal nocturnal dyspnea, palpitations GI: Denies abdominal pain, nausea, vomiting, diarrhea, hematochezia, melena, constipation, change in bowel habits GU: Denies dysuria, hematuria, polyuria, oliguria, urethral discharge Endocrine: Denies hot or cold intolerance, polyuria, polyphagia or  appetite change Derm: Denies rash, dry skin, scaling or peeling skin change Heme: Denies easy bruising, bleeding, bleeding gums Neuro: Denies headache, numbness, weakness, slurred speech, loss of memory or consciousness     Objective:   Physical Exam   Filed Vitals:    03/24/14 1456   BP:  128/72   Pulse:  98   SpO2:  84%    25L [place]  Gen: chronically ill appearing, no acute distress  HEENT: NCAT, EOMi, OP clear,  CV: RRR, no mgr, no JVD  AB: BS+, soft, nontender,  Ext: warm, no edema, no clubbing, no cyanosis  Derm: no rash or skin breakdown  Neuro: A&Ox4, MAEW   Assessment & Plan:   IPF (idiopathic pulmonary fibrosis)  His acute on chronic hypoxemic respiratory failure is worrisome for a flare of idiopathic pulmonary fibrosis. I explained to him today that if he had fever, purulent cough, or chest pain or leg swelling than I would consider either an infectious or cardiac etiology but I see no evidence of either at this time. However sometimes this can be difficult to sort out so we will get a chest x-ray to look for either one of those possibilities.  I explained to he and his wife at length today in clinic that either this is a flare of his idiopathic pulmonary fibrosis or it is just the end-stage of this chronic disease which we have no cure for. As noted in multiple previous notes he is not a transplant candidate. I do  not believe he will live long enough in order to be enrolled in clinical trials.  In the meantime, I have given him one of 2 options. Either go home on hospice now or go to the hospital and be treated for a flare of IPF with Solu-Medrol and then go home on hospice later. Mehki has been very reluctant to even consider hospice, and continues to be reluctant today. However after lengthy consideration and discussion in clinic he is willing to be admitted and then transition to hospice later.  Plan:  -Admit to [facility] long hospital stepdown unit  -Solu-Medrol 125 mg IV  every 6 hours  -CXR now   Acute on chronic respiratory failure  See the discussion above.  Plan:  -Titrate O2 saturation for greater than 88%  -Use Oxymizer in hospital to try to minimize home O2 needs   Diarrhea  This is related to his Ofev. He is not willing to give this up at this time so he will need to continue taking Imodium on an as-needed basis and follow the brat diet.  disposition: After hospitalization this week he needs to be transition to home hospice   CODE STATUS: DO NOT RESUSCITATE Today I discussed this with he and his wife and I strongly advised that he be DO NOT RESUSCITATE. They agreed  Updated Medication List  Outpatient Encounter Prescriptions as of 03/24/2014   Medication  Sig   .  aspirin 81 MG tablet  Take 81 mg by mouth daily.   .  brimonidine (ALPHAGAN) 0.15 % ophthalmic solution  Place 1 drop into the right eye 2 (two) times daily.   .  dorzolamide-timolol (COSOPT) 22.3-6.8 MG/ML ophthalmic solution  Place 1 drop into the right eye 2 (two) times daily.   .  famotidine (PEPCID) 20 MG tablet  One at bedtime   .  levothyroxine (SYNTHROID, LEVOTHROID) 150 MCG tablet  Take 150 mcg by mouth daily before breakfast.   .  Multiple Vitamins-Minerals (MULTIVITAL PO)  Take 1 tablet by mouth daily.   .  Nintedanib Esylate (OFEV) 150 MG CAPS  One twice daily with food   .  predniSONE (DELTASONE) 10 MG tablet  Take 1 tablet (10 mg total) by mouth daily with breakfast.   .  rosuvastatin (CRESTOR) 10 MG tablet  Take 10 mg by mouth daily.   .  tamsulosin (FLOMAX) 0.4 MG CAPS capsule  Take 0.4 mg by mouth daily.

## 2014-03-24 NOTE — Plan of Care (Signed)
Problem: Phase I Progression Outcomes Goal: O2 sats > or equal 90% or at baseline Outcome: Progressing Ordered to keep above 85%

## 2014-03-24 NOTE — Telephone Encounter (Signed)
Patient decided to come in and be seen today, wife is calling back to see if BQ will call in hospice [phone]

## 2014-03-24 NOTE — Progress Notes (Signed)
Subjective:    Patient ID: Bernard Griffith, male    DOB: 12/06/1940, 74 y.o.   MRN: [id number]  Synopsis: Idiopathic pulmonary fibrosis followed by Dr. Wynn Maudlin at [facility]. Has been taking Ofev since March 2015. Was turned down for lung transplant by the [facility] transplant team do to age, coronary disease, prostate cancer, and reflux  HPI   Chief Complaint  Patient presents with  . Follow-up    pt c/o increased sob with any exertion, has difficulty keeping 02 at appropriate level.     [name] returns to my clinic today quite short of breath. He says that for the last several weeks he has been experiencing worsening dyspnea at home. Just walking from his bedroom to his living room is an exhausting experience and on 2 separate occasions in the last week his wife has come very close to calling 911 because he was doing so poorly. He has been using as much as 25 L of oxygen on a near continuous basis. He states that he rests at home with 2 oxygen concentrators connected into the same nasal cannula, both going at 10 L/m. He has not had an increase in cough, chest pain, or leg swelling at home. He denies mucus production. His biggest problem is that his shortness of breath has just progressed in the last several weeks. He also notes diarrhea at home.   Past Medical History  Diagnosis Date  . Prostate cancer   . Pulmonary fibrosis   . Hypercholesteremia   . Hypothyroidism      Review of Systems  Constitutional: Positive for fatigue. Negative for fever and chills.  HENT: Negative for postnasal drip, rhinorrhea and sinus pressure.   Respiratory: Positive for cough and shortness of breath. Negative for choking and wheezing.   Cardiovascular: Negative for chest pain, palpitations and leg swelling.       Objective:   Physical Exam   Filed Vitals:   03/24/14 1456  BP: 128/72  Pulse: 98  SpO2: 84%   25L [place]  Gen: chronically ill appearing, no acute  distress HEENT: NCAT, EOMi, OP clear,  CV: RRR, no mgr, no JVD AB: BS+, soft, nontender, Ext: warm, no edema, no clubbing, no cyanosis Derm: no rash or skin breakdown Neuro: A&Ox4, MAEW       Assessment & Plan:   IPF (idiopathic pulmonary fibrosis) His acute on chronic hypoxemic respiratory failure is worrisome for a flare of idiopathic pulmonary fibrosis. I explained to him today that if he had fever, purulent cough, or chest pain or leg swelling than I would consider either an infectious or cardiac etiology but I see no evidence of either at this time. However sometimes this can be difficult to sort out so we will get a chest x-ray to look for either one of those possibilities.  I explained to he and his wife at length today in clinic that either this is a flare of his idiopathic pulmonary fibrosis or it is just the end-stage of this chronic disease which we have no cure for. As noted in multiple previous notes he is not a transplant candidate. I do not believe he will live long enough in order to be enrolled in clinical trials.  In the meantime, I have given him one of 2 options. Either go home on hospice now or go to the hospital and be treated for a flare of IPF with Solu-Medrol and then go home on hospice later. Bernard Griffith has  been very reluctant to even consider hospice, and continues to be reluctant today. However after lengthy consideration and discussion in clinic he is willing to be admitted and then transition to hospice later.  Plan: -Admit to [facility] long hospital stepdown unit -Solu-Medrol 125 mg IV every 6 hours -CXR now   Acute on chronic respiratory failure See the discussion above.  Plan: -Titrate O2 saturation for greater than 88% -Use Oxymizer in hospital to try to minimize home O2 needs   Diarrhea This is related to his Ofev. He is not willing to give this up at this time so he will need to continue taking Imodium on an as-needed basis and follow the brat diet.     disposition: After hospitalization this week he needs to be transition to home hospice  CODE STATUS: DO NOT RESUSCITATE Today I discussed this with he and his wife and I strongly advised that he be DO NOT RESUSCITATE. They agreed   Updated Medication List Outpatient Encounter Prescriptions as of 03/24/2014  Medication Sig  . aspirin 81 MG tablet Take 81 mg by mouth daily.  . brimonidine (ALPHAGAN) 0.15 % ophthalmic solution Place 1 drop into the right eye 2 (two) times daily.  . dorzolamide-timolol (COSOPT) 22.3-6.8 MG/ML ophthalmic solution Place 1 drop into the right eye 2 (two) times daily.  . famotidine (PEPCID) 20 MG tablet One at bedtime  . levothyroxine (SYNTHROID, LEVOTHROID) 150 MCG tablet Take 150 mcg by mouth daily before breakfast.  . Multiple Vitamins-Minerals (MULTIVITAL PO) Take 1 tablet by mouth daily.  . Nintedanib Esylate (OFEV) 150 MG CAPS One twice daily with food  . predniSONE (DELTASONE) 10 MG tablet Take 1 tablet (10 mg total) by mouth daily with breakfast.  . rosuvastatin (CRESTOR) 10 MG tablet Take 10 mg by mouth daily.   . tamsulosin (FLOMAX) 0.4 MG CAPS capsule Take 0.4 mg by mouth daily.

## 2014-03-24 NOTE — Assessment & Plan Note (Signed)
See the discussion above.  Plan: -Titrate O2 saturation for greater than 88% -Use Oxymizer in hospital to try to minimize home O2 needs

## 2014-03-24 NOTE — Telephone Encounter (Signed)
Spoke with Linda(patients wife)-she wanted to know if hospice would visit them while patient is admitted to hospital-she is aware that the patient will be set up with hospice after discharged from the hospital. Nothing more needed at this time.

## 2014-03-25 ENCOUNTER — Inpatient Hospital Stay (HOSPITAL_COMMUNITY): Payer: Medicare Other

## 2014-03-25 LAB — BASIC METABOLIC PANEL
Anion gap: 8 (ref 5–15)
BUN: 21 mg/dL (ref 6–23)
CO2: 21 mmol/L (ref 19–32)
Calcium: 9.1 mg/dL (ref 8.4–10.5)
Chloride: 107 mmol/L (ref 96–112)
Creatinine, Ser: 0.74 mg/dL (ref 0.50–1.35)
GFR calc Af Amer: >90 mL/min (ref >90)
GFR calc non Af Amer: 89 mL/min — ABNORMAL LOW (ref >90)
GLUCOSE: 139 mg/dL — AB (ref 70–99)
Potassium: 4.5 mmol/L (ref 3.5–5.1)
SODIUM: 136 mmol/L (ref 135–145)

## 2014-03-25 LAB — TROPONIN I
Troponin I: 0.1 ng/mL — ABNORMAL HIGH (ref <0.031)
Troponin I: 0.1 ng/mL — ABNORMAL HIGH (ref <0.031)

## 2014-03-25 LAB — CBC
HCT: 44.1 % (ref 39.0–52.0)
Hemoglobin: 14.7 g/dL (ref 13.0–17.0)
MCH: 29.2 pg (ref 26.0–34.0)
MCHC: 33.3 g/dL (ref 30.0–36.0)
MCV: 87.5 fL (ref 78.0–100.0)
Platelets: 292 10*3/uL (ref 150–400)
RBC: 5.04 MIL/uL (ref 4.22–5.81)
RDW: 15.4 % (ref 11.5–15.5)
WBC: 5.7 10*3/uL (ref 4.0–10.5)

## 2014-03-25 MED ORDER — FUROSEMIDE 10 MG/ML IJ SOLN
40.0000 mg | Freq: Once | INTRAMUSCULAR | Status: AC
  Administered 2014-03-25: 40 mg via INTRAVENOUS
  Filled 2014-03-25: qty 4

## 2014-03-25 MED ORDER — ALPRAZOLAM 0.25 MG PO TABS
0.2500 mg | ORAL_TABLET | Freq: Three times a day (TID) | ORAL | Status: DC | PRN
  Administered 2014-03-25 – 2014-03-27 (×4): 0.25 mg via ORAL
  Filled 2014-03-25 (×4): qty 1

## 2014-03-25 MED ORDER — POTASSIUM CHLORIDE CRYS ER 20 MEQ PO TBCR
40.0000 meq | EXTENDED_RELEASE_TABLET | Freq: Once | ORAL | Status: AC
  Administered 2014-03-25: 40 meq via ORAL
  Filled 2014-03-25: qty 2

## 2014-03-25 NOTE — Progress Notes (Signed)
Name: Bernard Griffith MRN: [id number] DOB: 08/29/1940    ADMISSION DATE:  03/24/2014  REFERRING MD :  Dr. Lake Bells   CHIEF COMPLAINT:  Dyspnea   BRIEF PATIENT DESCRIPTION: 74 y/o M with PMH of IPF (followed by Dr. Randol Kern at [facility], turned down for transplant) who presented to the pulmonary office on 2/15 with worsening SOB.  Directly admitted from the office for further evaluation.    SIGNIFICANT EVENTS  2/15  Admit from pulmonary office with worsening SOB, on 100% NRB 2/16  No distress, work of breathing improved  STUDIES:      SUBJECTIVE: Pt reports some improvement in dyspnea.  Continues to wear NRB.    VITAL SIGNS: Temp:  [97 F (36.1 C)-98.7 F (37.1 C)] 97.4 F (36.3 C) (02/16 0800) Pulse Rate:  [63-123] 67 (02/16 0800) Resp:  [18-32] 21 (02/16 0800) BP: (83-136)/(46-87) 122/66 mmHg (02/16 0800) SpO2:  [84 %-100 %] 98 % (02/16 0800) Weight:  [160 lb 0.9 oz (72.6 kg)] 160 lb 0.9 oz (72.6 kg) (02/15 1608)  PHYSICAL EXAMINATION: General:  Chronically ill appearing male in NAD Neuro:  NCAT, AAOx4, speech clear, OP clear HEENT:  Mm pink/moist, no jvd Cardiovascular:  s1s2 rrr, no m/r/g, no jvd Lungs:  resp's even/non-labored, lungs bilaterally with few basilar crackles posterior lower  Abdomen:  BSx4 active, soft, NTND Musculoskeletal:  No acute deformities  Skin:  Warm/dry, no edema   Recent Labs Lab 03/24/14 1800 03/25/14 0500  NA 138 136  K 4.8 4.5  CL 105 107  CO2 27 21  BUN 22 21  CREATININE 0.88 0.74  GLUCOSE 101* 139*    Recent Labs Lab 03/24/14 1800 03/25/14 0500  HGB 15.2 14.7  HCT 45.6 44.1  WBC 16.7* 5.7  PLT 330 292   Dg Chest Port 1 View  03/25/2014   CLINICAL DATA:  Edema, infiltrate, pulmonary fibrosis.  EXAM: PORTABLE CHEST - 1 VIEW  COMPARISON:  03/24/2014  FINDINGS: There is moderate cardiomegaly, unchanged. There is extensive fibrotic appearing interstitial coarsening throughout both lungs without significant interval change. No  superimposed acute infiltrate or congestive heart failure is evident. No large effusions are evident.  IMPRESSION: Diffuse fibrotic appearing changes. Cardiomegaly. No significant interval change.   Electronically Signed   By: Andreas Newport M.D.   On: 03/25/2014 06:59   Dg Chest Port 1 View  03/24/2014   CLINICAL DATA:  Cough and shortness of breath, history of pulmonary fibrosis, previous tobacco use, and prostate malignancy.  EXAM: PORTABLE CHEST - 1 VIEW  COMPARISON:  PA and lateral chest x-ray of January 28, 2013 and high-resolution CT scan of the chest of February 13, 2013  FINDINGS: The lungs are adequately inflated. The pulmonary interstitial markings remain increased and are more conspicuous overall. There is no alveolar infiltrate. There is no pleural effusion. The cardiopericardial silhouette is enlarged. The pulmonary vascularity is not engorged. The mediastinum is normal in width. The bony thorax is unremarkable.  IMPRESSION: Progression in the fibrotic changes in both lungs. Stable cardiomegaly without evidence of pulmonary edema.   Electronically Signed   By: David  Martinique   On: 03/24/2014 17:13    ASSESSMENT / PLAN:  IPF - end stage, has been declined for transplant at [facility] Acute on Chronic Hypoxemic Respiratory Failure - worrisome for IPF flare.    Plan: Titrate O2 for sats > 88% Have asked wife to bring home Oxymizer in to be utilized for dose titration (he has not been using it at  home due to technical difficulties with cord reaching ares of the home) Pulmonary hygiene Empiric abx for CAP but no acute infiltrate on plain film, consider narrow vs d/c soon  Lasix 40 mg IV x1 + KCL   Anxiety   Plan: PRN low dose xanax (0.25 mg) for dyspnea related anxiety.  Discussed use with patient prior to exertional activities or periods of increasing dyspnea.   Would like to trial low dose morphine while in hospital but he is naive and will require monitoring for titration.  Want to avoid  oversedation as he has been very sensitive to medications (xanax) in the past   Diarrhea - likely related to Ofev  Plan: PRN imodium  PRN BRAT diet   Goals of Care - DNR / DNI.  Maximize quality of life with ongoing symptom treatment. Patient has been very reluctant to consider hospice in past.  Extensive discussion with patient by Dr. Lake Bells.  Pending Palliative Care consult for symptom management and home hospice arrangement.    Noe Gens, NP-C [facility] Pgr: [phone] or [phone] 03/25/2014, 10:12 AM  Attending Note:  I have examined patient, reviewed labs, studies and notes. I have discussed the case with B Ollis, and I agree with the data and plans as amended above.  Baltazar Apo, MD, PhD 03/25/2014, 12:54 PM [facility] [phone] or if no answer [phone]

## 2014-03-25 NOTE — Progress Notes (Signed)
CARE MANAGEMENT NOTE 03/25/2014  Patient:  Bernard Griffith, Bernard Griffith   Account Number:  [id number]  Date Initiated:  03/25/2014  Documentation initiated by:  DAVIS,RHONDA  Subjective/Objective Assessment:   pt with hx of copd and using oximeizer and o2 contrator at home unable to get full breath.  o2 level dropped to 87"%.     Action/Plan:   home when stable   Anticipated DC Date:  03/28/2014   Anticipated DC Plan:  HOME/SELF CARE  In-house referral  NA      DC Planning Services  CM consult      Choice offered to / List presented to:             Status of service:  In process, will continue to follow Medicare Important Message given?   (If response is "NO", the following Medicare IM given date fields will be blank) Date Medicare IM given:   Medicare IM given by:   Date Additional Medicare IM given:   Additional Medicare IM given by:    Discharge Disposition:    Per UR Regulation:  Reviewed for med. necessity/level of care/duration of stay  If discussed at [place] of Stay Meetings, dates discussed:    Comments:  Feb. 16 2016/Rhonda L. Rosana Hoes, RN, BSN, CCM/Case Management [place] [phone] No discharge needs present of time of review.

## 2014-03-25 NOTE — Progress Notes (Signed)
Took up to 55 due to patient desaturating.  Pam RN called to assess patient

## 2014-03-26 ENCOUNTER — Encounter (HOSPITAL_COMMUNITY): Payer: Self-pay | Admitting: *Deleted

## 2014-03-26 DIAGNOSIS — J9621 Acute and chronic respiratory failure with hypoxia: Secondary | ICD-10-CM

## 2014-03-26 DIAGNOSIS — Z515 Encounter for palliative care: Secondary | ICD-10-CM

## 2014-03-26 DIAGNOSIS — Z66 Do not resuscitate: Secondary | ICD-10-CM

## 2014-03-26 LAB — CBC
HCT: 42.9 % (ref 39.0–52.0)
HEMOGLOBIN: 14.5 g/dL (ref 13.0–17.0)
MCH: 29.5 pg (ref 26.0–34.0)
MCHC: 33.8 g/dL (ref 30.0–36.0)
MCV: 87.2 fL (ref 78.0–100.0)
Platelets: 317 10*3/uL (ref 150–400)
RBC: 4.92 MIL/uL (ref 4.22–5.81)
RDW: 15.4 % (ref 11.5–15.5)
WBC: 14.7 10*3/uL — AB (ref 4.0–10.5)

## 2014-03-26 LAB — BASIC METABOLIC PANEL
ANION GAP: 9 (ref 5–15)
BUN: 29 mg/dL — ABNORMAL HIGH (ref 6–23)
CO2: 24 mmol/L (ref 19–32)
CREATININE: 0.84 mg/dL (ref 0.50–1.35)
Calcium: 9 mg/dL (ref 8.4–10.5)
Chloride: 103 mmol/L (ref 96–112)
GFR calc Af Amer: >90 mL/min (ref >90)
GFR calc non Af Amer: 84 mL/min — ABNORMAL LOW (ref >90)
Glucose, Bld: 140 mg/dL — ABNORMAL HIGH (ref 70–99)
POTASSIUM: 4.2 mmol/L (ref 3.5–5.1)
SODIUM: 136 mmol/L (ref 135–145)

## 2014-03-26 MED ORDER — FUROSEMIDE 10 MG/ML IJ SOLN
40.0000 mg | Freq: Once | INTRAMUSCULAR | Status: AC
  Administered 2014-03-26: 40 mg via INTRAVENOUS
  Filled 2014-03-26: qty 4

## 2014-03-26 MED ORDER — POTASSIUM CHLORIDE CRYS ER 20 MEQ PO TBCR
40.0000 meq | EXTENDED_RELEASE_TABLET | Freq: Once | ORAL | Status: AC
  Administered 2014-03-26: 40 meq via ORAL
  Filled 2014-03-26: qty 2

## 2014-03-26 NOTE — Progress Notes (Signed)
PT decided to continue using Oximyzer- RT reduced to 12lpm 02- Sp02 90%- RN aware.

## 2014-03-26 NOTE — Plan of Care (Signed)
Problem: Phase I Progression Outcomes Goal: Tolerating diet Outcome: Completed/Met Date Met:  03/26/14 Reg diet. Fair appetite.

## 2014-03-26 NOTE — Progress Notes (Signed)
Add to previous note- RT spoke with APS and male stated that it is appropriate to use standard 02 extension tubing with Oximyzer.

## 2014-03-26 NOTE — Progress Notes (Signed)
Name: Bernard Griffith MRN: [id number] DOB: 09-20-1940    ADMISSION DATE:  03/24/2014  REFERRING MD :  Dr. Lake Bells   CHIEF COMPLAINT:  Dyspnea   BRIEF PATIENT DESCRIPTION: 74 y/o M with PMH of IPF (followed by Dr. Randol Kern at [facility], turned down for transplant) who presented to the pulmonary office on 2/15 with worsening SOB.  Directly admitted from the office for further evaluation.    SIGNIFICANT EVENTS  2/15  Admit from pulmonary office with worsening SOB, on 100% NRB 2/16  No distress, work of breathing improved  STUDIES:  SUBJECTIVE:  Believes that his dyspnea improved after empiric diuresis Also benefited from xanax for sleep Palliative care has seen him but he is resistant to home hospice  VITAL SIGNS: Temp:  [96 F (35.6 C)-97.5 F (36.4 C)] 96 F (35.6 C) (02/17 0800) Pulse Rate:  [57-84] 57 (02/17 0400) Resp:  [15-28] 16 (02/17 0400) BP: (80-129)/(40-80) 94/54 mmHg (02/17 0400) SpO2:  [78 %-99 %] 97 % (02/17 0400) FiO2 (%):  [50 %-55 %] 55 % (02/16 2000) Weight:  [70.9 kg (156 lb 4.9 oz)] 70.9 kg (156 lb 4.9 oz) (02/17 0500)  PHYSICAL EXAMINATION: General:  Chronically ill appearing male in NAD Neuro:  NCAT, AAOx4, speech clear, OP clear HEENT:  Mm pink/moist, no jvd Cardiovascular:  s1s2 rrr, no m/r/g, no jvd Lungs:  resp's even/non-labored, lungs bilaterally with few basilar crackles posterior lower  Abdomen:  BSx4 active, soft, NTND Musculoskeletal:  No acute deformities  Skin:  Warm/dry, no edema   Recent Labs Lab 03/24/14 1800 03/25/14 0500 03/26/14 0422  NA 138 136 136  K 4.8 4.5 4.2  CL 105 107 103  CO2 27 21 24   BUN 22 21 29*  CREATININE 0.88 0.74 0.84  GLUCOSE 101* 139* 140*    Recent Labs Lab 03/24/14 1800 03/25/14 0500 03/26/14 0422  HGB 15.2 14.7 14.5  HCT 45.6 44.1 42.9  WBC 16.7* 5.7 14.7*  PLT 330 292 317   Dg Chest Port 1 View  03/25/2014   CLINICAL DATA:  Edema, infiltrate, pulmonary fibrosis.  EXAM: PORTABLE CHEST - 1  VIEW  COMPARISON:  03/24/2014  FINDINGS: There is moderate cardiomegaly, unchanged. There is extensive fibrotic appearing interstitial coarsening throughout both lungs without significant interval change. No superimposed acute infiltrate or congestive heart failure is evident. No large effusions are evident.  IMPRESSION: Diffuse fibrotic appearing changes. Cardiomegaly. No significant interval change.   Electronically Signed   By: Andreas Newport M.D.   On: 03/25/2014 06:59   Dg Chest Port 1 View  03/24/2014   CLINICAL DATA:  Cough and shortness of breath, history of pulmonary fibrosis, previous tobacco use, and prostate malignancy.  EXAM: PORTABLE CHEST - 1 VIEW  COMPARISON:  PA and lateral chest x-ray of January 28, 2013 and high-resolution CT scan of the chest of February 13, 2013  FINDINGS: The lungs are adequately inflated. The pulmonary interstitial markings remain increased and are more conspicuous overall. There is no alveolar infiltrate. There is no pleural effusion. The cardiopericardial silhouette is enlarged. The pulmonary vascularity is not engorged. The mediastinum is normal in width. The bony thorax is unremarkable.  IMPRESSION: Progression in the fibrotic changes in both lungs. Stable cardiomegaly without evidence of pulmonary edema.   Electronically Signed   By: David  Martinique   On: 03/24/2014 17:13    ASSESSMENT / PLAN:  IPF - end stage, has been declined for transplant at [facility] Acute on Chronic Hypoxemic Respiratory Failure -  possible IPF flare.    Plan: Titrate O2 for sats > 88% Will try to set up oximyzer while admitted in prep for use at home Pulmonary hygiene Will d/c empiric ceftriaxone 2/17 and follow clinically for evidence infxn Repeat Lasix 40 mg IV x1 + KCL on 2/17  Anxiety   Plan: PRN low dose xanax (0.25 mg) for dyspnea related anxiety.  Discussed use with patient prior to exertional activities or periods of increasing dyspnea.   Would like to trial low dose  morphine while in hospital but he is naive and will require monitoring for titration.  Want to avoid oversedation as he has been very sensitive to medications (xanax) in the past   Diarrhea - likely related to Ofev  Plan: PRN imodium  PRN BRAT diet   Goals of Care - DNR / DNI.  Maximize quality of life with ongoing symptom treatment. Patient has been very reluctant to consider hospice in past and did not want extended discussion with Palliative Care Consultant on this hospitalization either.  It would likely be difficult to set up home hospice at this juncture - pt and family are concentrating on ways to increase to high flow O2, potential for transplant eval at another institution, etc    Baltazar Apo, MD, PhD 03/26/2014, 9:59 AM [facility] [place] [phone] or if no answer [phone]

## 2014-03-26 NOTE — Progress Notes (Signed)
RT placed PT on provided Oximyzer at 15 lpm 02- current Sp02 90-91%. Advised  PT and daughter that 34 needs to be at 15lpm and no more than 1 standard 02 extension tubing to result in appropriate Sp02. Caregiver enetered hallway and asked that PT be placed back on standard [place] and VM- Oximyzer to "much." RN aware. Advised PT to consult with APS to determine utilization on home concentrators.

## 2014-03-26 NOTE — Care Management Note (Signed)
CARE MANAGEMENT NOTE 03/26/2014  Patient:  Bernard Griffith, Bernard Griffith   Account Number:  [id number]  Date Initiated:  03/25/2014  Documentation initiated by:  DAVIS,RHONDA  Subjective/Objective Assessment:   pt with hx of copd and using oximeizer and o2 contrator at home unable to get full breath.  o2 level dropped to 87"%.     Action/Plan:   home when stable   Anticipated DC Date:  03/28/2014   Anticipated DC Plan:  HOME/SELF CARE  In-house referral  Hospice / [place]  CM consult      [facility] Choice  NA   Choice offered to / List presented to:  C-3 Spouse           HH agency  [facility] [place]   Status of service:  In process, will continue to follow Medicare Important Message given?   (If response is "NO", the following Medicare IM given date fields will be blank) Date Medicare IM given:   Medicare IM given by:   Date Additional Medicare IM given:   Additional Medicare IM given by:    Discharge Disposition:    Per UR Regulation:  Reviewed for med. necessity/level of care/duration of stay  If discussed at Long Length of Stay Meetings, dates discussed:    Comments:  Feb. 17,2016/Rhonda Davis,RN,BSN,CCM: spoke with the wife, daughter and patient concerning home hospice.  Wife stated that they are ready for this and that she does not need anything for the home or the patient at this time.  Contact information for hospice of [place] and myself given to wife if she changes her mind or changes mind when patient gets home.  Feb. 16 2016/Rhonda L. Rosana Hoes, RN, BSN, CCM/Case Management [place] [phone] No discharge needs present of time of review.

## 2014-03-26 NOTE — Consult Note (Signed)
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 Randol Kern at [facility], turned down for transplant), admitted on 03-24-14 with increased SOB  Patient is faced with advanced directive decisions and anticipatory care needs.  Consult is for review of medical treatment options, clarification of goals of care and end of life issues, disposition and options, and symptom recommendation.  This NP Wadie Lessen reviewed medical records, received report from team, assessed the patient and then meet at the patient's bedside along with his daughter Sharee Pimple  to discuss diagnosis, prognosis, GOC, EOL wishes disposition and options.  A detailed discussion was had today regarding advanced directives.  Concepts specific to code status, artifical feeding and hydration, continued IV antibiotics and rehospitalization was had.  The difference between a aggressive medical intervention path  and a palliative comfort care path for this patient at this time was had.  Values and goals of care important to patient and family were attempted to be elicited.  Concepts of Hospice and Palliative Care were discussed  Natural trajectory and expectations at EOL were discussed.  Questions and concerns addressed. MOST form discussed. Family encouraged to call with questions or concerns.  PMT will continue to support holistically.   Goals of Care: 1.  Code Status:DNR/DNI   2. Scope of Treatment: Patient is open to all available and offered medical interventions to prolong life.  He understands her limited prognosis yet remains hopeful for new medications and treatments to treat his  PF. Encouraged patient to continue conversation with his family focusing on  his GOCs and individual wishes in order to enhance patient centered care  3. Disposition:  Home with hospice vs home health, will write for choice Individual hospice will deem eligibility  4. Psychosocial:    Emotional support offered to patient and his daughter at bedside.  He was able to verbalize his desire for prolonged quality of life, but also that he will "know" when it is time "to stop" and focus on comfort.  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 Randol Kern at [facility], turned down for transplant), admitted on 03-24-14 with increased SOB.     ROS: weakness, fatigue, increased SOB with minimal exertion   PMH:  Past Medical History  Diagnosis Date  . Prostate cancer   . Pulmonary fibrosis   . Hypercholesteremia   . Hypothyroidism      PSH: Past Surgical History  Procedure Laterality Date  . Insertion prostate radiation seed  2012   I have reviewed the [place] and SH and  If appropriate update it with new information. No Known Allergies Scheduled Meds: . aspirin EC  81 mg Oral Daily  . azithromycin  500 mg Intravenous Q24H  . brimonidine  1 drop Right Eye BID  . dorzolamide-timolol  1 drop Right Eye BID  . heparin  5,000 Units Subcutaneous 3 times per day  . levothyroxine  150 mcg Oral QAC breakfast  . methylPREDNISolone (SOLU-MEDROL) injection  40 mg Intravenous Q6H  . Nintedanib Esylate  150 mg Oral BID  . pantoprazole (PROTONIX) IV  40 mg Intravenous Q12H  . rosuvastatin  10 mg Oral Daily  . tamsulosin  0.4 mg Oral Daily   Continuous Infusions: . sodium chloride 10 mL/hr at 03/24/14 1745   PRN Meds:.sodium chloride,  acetaminophen, ALPRAZolam, ondansetron (ZOFRAN) IV, sodium chloride    BP 90/63 mmHg  Pulse 74  Temp(Src) 96 F (35.6 C) (Axillary)  Resp 23  Ht 5\' 8"  (1.727 m)  Wt 70.9 kg (156 lb 4.9 oz)  BMI 23.77 kg/m2  SpO2 91%   PPS:30 % at best   Intake/Output Summary (Last 24 hours) at 03/26/14 1206 Last data filed at 03/26/14 1154  Gross per 24 hour  Intake   1160 ml  Output   3030 ml  Net  -1870 ml    Physical Exam:  General: Chronically ill appearing, NAD HEENT:  Moist buccal membranes Chest:  Diminished throughout CVS: RRR  Labs: CBC    Component Value Date/Time   WBC 14.7* 03/26/2014 0422   RBC 4.92 03/26/2014 0422   HGB 14.5 03/26/2014 0422   HCT 42.9 03/26/2014 0422   PLT 317 03/26/2014 0422   MCV 87.2 03/26/2014 0422   MCH 29.5 03/26/2014 0422   MCHC 33.8 03/26/2014 0422   RDW 15.4 03/26/2014 0422   LYMPHSABS 2.4 03/24/2014 1800   MONOABS 1.3* 03/24/2014 1800   EOSABS 0.2 03/24/2014 1800   BASOSABS 0.0 03/24/2014 1800    BMET    Component Value Date/Time   NA 136 03/26/2014 0422   K 4.2 03/26/2014 0422   CL 103 03/26/2014 0422   CO2 24 03/26/2014 0422   GLUCOSE 140* 03/26/2014 0422   BUN 29* 03/26/2014 0422   CREATININE 0.84 03/26/2014 0422   CALCIUM 9.0 03/26/2014 0422   GFRNONAA 84* 03/26/2014 0422   GFRAA >90 03/26/2014 0422    CMP     Component Value Date/Time   NA 136 03/26/2014 0422   K 4.2 03/26/2014 0422   CL 103 03/26/2014 0422   CO2 24 03/26/2014 0422   GLUCOSE 140* 03/26/2014 0422   BUN 29* 03/26/2014 0422   CREATININE 0.84 03/26/2014 0422   CALCIUM 9.0 03/26/2014 0422   PROT 6.9 03/24/2014 1800   ALBUMIN 3.7 03/24/2014 1800   AST 35 03/24/2014 1800   ALT 55* 03/24/2014 1800   ALKPHOS 57 03/24/2014 1800   BILITOT 1.4* 03/24/2014 1800   GFRNONAA 84* 03/26/2014 0422   GFRAA >90 03/26/2014 0422    Time In Time Out Total Time Spent with Patient Total Overall Time  1030 1130 60 min 60 min    Greater than 50%  of this time  was spent counseling and coordinating care related to the above assessment and plan.   Wadie Lessen NP  Palliative Medicine Team Team Phone # [phone] Pager [phone]  Discussed with Dr Lamonte Sakai

## 2014-03-27 ENCOUNTER — Encounter (HOSPITAL_COMMUNITY): Payer: Self-pay | Admitting: Pulmonary Disease

## 2014-03-27 MED ORDER — PANTOPRAZOLE SODIUM 40 MG PO TBEC
40.0000 mg | DELAYED_RELEASE_TABLET | Freq: Every day | ORAL | Status: DC
  Administered 2014-03-27 – 2014-03-28 (×2): 40 mg via ORAL
  Filled 2014-03-27 (×2): qty 1

## 2014-03-27 MED ORDER — CETYLPYRIDINIUM CHLORIDE 0.05 % MT LIQD
7.0000 mL | Freq: Two times a day (BID) | OROMUCOSAL | Status: DC
  Administered 2014-03-27 – 2014-03-28 (×2): 7 mL via OROMUCOSAL

## 2014-03-27 NOTE — Progress Notes (Signed)
Name: Bernard Griffith MRN: [id number] DOB: Nov 19, 1940    ADMISSION DATE:  03/24/2014  REFERRING MD :  Dr. Lake Bells   CHIEF COMPLAINT:  Dyspnea   BRIEF PATIENT DESCRIPTION: 74 y/o M with PMH of IPF (followed by Dr. Randol Kern at [facility], turned down for transplant) who presented to the pulmonary office on 2/15 with worsening SOB.  Directly admitted from the office for further evaluation.    SIGNIFICANT EVENTS  2/15  Admit from pulmonary office with worsening SOB, on 100% NRB 2/16  No distress, work of breathing improved  STUDIES:  SUBJECTIVE:  Transitioned to oxymizer to see how he would tolerate; difficulty with 15L/min, but was able to do 12L/min. APS indicates that the oxymizer is compatible with standard O2 tubing  VITAL SIGNS: Temp:  [97.2 F (36.2 C)-97.7 F (36.5 C)] 97.7 F (36.5 C) (02/18 0800) Pulse Rate:  [34-80] 60 (02/18 0600) Resp:  [17-33] 18 (02/18 0600) BP: (62-114)/(25-63) 81/52 mmHg (02/18 0600) SpO2:  [84 %-98 %] 98 % (02/18 0600) Weight:  [71.2 kg (156 lb 15.5 oz)] 71.2 kg (156 lb 15.5 oz) (02/18 0500)  PHYSICAL EXAMINATION: General:  Chronically ill appearing male in NAD Neuro:  NCAT, AAOx4, speech clear, OP clear HEENT:  Mm pink/moist, no jvd Cardiovascular:  s1s2 rrr, no m/r/g, no jvd Lungs:  resp's even/non-labored, B LL insp fine crackles Abdomen:  BSx4 active, soft, NTND Musculoskeletal:  No acute deformities  Skin:  Warm/dry, no edema   Recent Labs Lab 03/24/14 1800 03/25/14 0500 03/26/14 0422  NA 138 136 136  K 4.8 4.5 4.2  CL 105 107 103  CO2 27 21 24   BUN 22 21 29*  CREATININE 0.88 0.74 0.84  GLUCOSE 101* 139* 140*    Recent Labs Lab 03/24/14 1800 03/25/14 0500 03/26/14 0422  HGB 15.2 14.7 14.5  HCT 45.6 44.1 42.9  WBC 16.7* 5.7 14.7*  PLT 330 292 317   No results found.  ASSESSMENT / PLAN:  IPF - end stage, has been declined for transplant at [facility] Acute on Chronic Hypoxemic Respiratory Failure - possible IPF flare.     Plan: Titrate O2 for sats > 88% Goal go home with oxymizer, optimize O2 delivery through APS Pulmonary hygiene d/c'd empiric ceftriaxone/azithro 2/17 and follow clinically for evidence infxn Repeated Lasix 40 mg IV x1 + KCL on 2/17 (negative 1L for hospitalization) Will plan a slow steroid taper at discharge  Anxiety   Plan: PRN low dose xanax (0.25 mg) for dyspnea related anxiety.  Discussed use with patient prior to exertional activities or periods of increasing dyspnea.   Would like to trial low dose morphine while in hospital but he is naive and will require monitoring for titration.  Want to avoid oversedation as he has been very sensitive to medications (xanax) in the past   Diarrhea - likely related to Ofev  Plan: PRN imodium  PRN BRAT diet   Goals of Care - DNR / DNI.  Maximize quality of life with ongoing symptom treatment. Patient and his wife remain reluctant to consider hospice and did not want extended discussion with Palliative Care Consultant on this hospitalization.  It would likely be difficult to set up home hospice at this juncture - pt and family are concentrating on ways to increase to high flow O2, potential for transplant eval at another institution, etc. Home health support would be appropriate. He is likely approaching discharge. PT to see today.    Baltazar Apo, MD, PhD 03/27/2014, 9:13  AM [facility] [phone] or if no answer [phone]

## 2014-03-27 NOTE — Progress Notes (Signed)
RN calling eMD  Problems fitting compatibility his o2 with pendant and home o2 machine. He is on 12L [place]  Case revioew and d/w patient  A. Try [place] without pendant - goal pulse ox > 88% B. Not eligibile for any ongoing IPF studies that we have   Dr. Brand Males, M.D., [facility].C.P Pulmonary and Critical Care Medicine Staff Physician [facility] Pager: [phone], If no answer or between  15:00h - 7:00h: call [phone]  03/27/2014 7:35 PM

## 2014-03-27 NOTE — Discharge Summary (Signed)
Physician Discharge Summary  Patient ID: IBAN UTZ MRN: [id number] DOB/AGE: 75-11-1940 75 y.o.  Admit date: 03/24/2014 Discharge date: 03/28/2014    Discharge Diagnoses:  IPF  Acute on Chronic Hypoxic Respiratory Failure  Anxiety  Diarrhea  GERD Hypothyroidism  Hyperlipidemia  BPH  DNR/DNI                                                                      DISCHARGE PLAN BY DIAGNOSIS     IPF  Acute on Chronic Hypoxic Respiratory Failure   Discharge Plan: -Goal O2 saturations > 88%.  Patient counseled on not watching numbers so much as how he feels in regards to dyspnea  -Home with home Oxymizer, on 10-12L O2 at rest while inpatient.  25L with activity.   Prednisone 60 mg QD with slow taper.  Plan to remain on 60 mg until seen in office by NP, then 10 mg per week reduction back to 10 mg / day.    -Continue Ofev  -Home health RT, PT, Aide, rolling walker with seat and 3-in-1 shower chair arranged  -Pt previously had oxymizer at home but was not using it due a technical problem of hooking the oxymizer to the home concentrator (the O2 tubing is factory attached to the concentrator).  Home DME company aware and are seeking to find a solution to issue.   -Patient referred to Pulmonary Fibrosis Support Group via [facility] (his cell [phone])  Anxiety   Discharge Plan: PRN low dose xanax  Instructed pt to use prior to periods with expected increased exertion & to take breaks with activity   Diarrhea - reports improvement while inpatient.  He was on protonix instead of pepcid (only difference in baseline regimen) GERD   Discharge Plan: PRN imodium Continue protonix for GERD rx    Hypothyroidism   Discharge Plan: Continue synthroid Follow up TSH as outpatient with PCP    Hyperlipidemia   Discharge Plan: Resume Crestor Follow up with Cardiology as previously arranged   BPH   Discharge Plan: Resume flomax    DNR / DNI   Discharge Plan: Patient  was reluctant to discuss hospice while inpatient.  He was evaluated and felt not to be hospice eligible with patient and family concentrating on ways to increase high flow O2 at home, seeking potential for other transplant evaluations etc.                  DISCHARGE SUMMARY   MAXIMILLION GILL is a 74 y.o. y/o male with a PMH of prostate cancer s/p seed implant, hypothyroidism, hypercholesteremia, GERD, and end-stage pulmonary fibrosis followed by Dr. Randol Kern at [facility] (on [place] since 04/2013, turned down for lung transplant who presented to [facility] clinic on 2/15 for complaints of increasing SOB with any exertion and difficulties maintaining O2 saturations.  He reported two separate occasions in the week prior of calling 911 due to significant exhaustion & dyspnea with ambulating from the bedroom to the living room.  He has two oxygen concentrators at home and has been using up to 25 liters O2.  On arrival to the pulmonary office, he was noted to be short of breath with poor coloring and was directly  admitted to [facility] for further work up.  Code status was discussed in office and he was made a DNR.    The patient was admitted to the ICU and empirically treated with IV steroids, empiric CAP coverage and oxygen titration.  He was also empirically treated with IV lasix with negative 1L + for admission.  The patient had slow improvement in baseline dyspnea.  Steroids were transitioned to PO prior to discharge.  Diarrhea was reduced / improved during hospitalization.  The only change in baseline regimen was pepcid to protonix.    He was evaluated by physical therapy and recommended for home health PT.  Oxygen was assessed with ambulation (approx 60 feet) and he desaturated to 82% on 12L via oxymizer.  O2 was turned to 25L via tank with saturations of 85-95%.    Home health PT, RT and an aide were arranged prior to discharge.      ANTIBIOTICS Azithromycin 2/15 >>  2/17 Rocephin 2/15 >> 2/17   CONSULTS Physical Therapy     Discharge Exam: General: chronically ill appearing adult male in NAD Neuro: AAOx4, speech clear, MAE CV: s1s2 rrr, no m/r/g PULM: resp's non-labored at rest, bilateral LL fine inspiratory crackles  GI: NTND, bsx4 active  Extremities: warm/dry, no edema   Filed Vitals:   03/28/14 0800 03/28/14 1000 03/28/14 1200 03/28/14 1202  BP: 125/66 119/59    Pulse: 57 69    Temp: 97.8 F (36.6 C)  97.1 F (36.2 C)   TempSrc: Axillary  Axillary   Resp: 17 27    Height:      Weight:      SpO2: 99% 93%  85%     Discharge Labs  BMET  Recent Labs Lab 03/24/14 1800 03/25/14 0500 03/26/14 0422  NA 138 136 136  K 4.8 4.5 4.2  CL 105 107 103  CO2 27 21 24   GLUCOSE 101* 139* 140*  BUN 22 21 29*  CREATININE 0.88 0.74 0.84  CALCIUM 9.3 9.1 9.0   CBC  Recent Labs Lab 03/24/14 1800 03/25/14 0500 03/26/14 0422  HGB 15.2 14.7 14.5  HCT 45.6 44.1 42.9  WBC 16.7* 5.7 14.7*  PLT 330 292 317        Follow-up Information    Follow up with PARRETT,TAMMY, NP On 04/11/2014.   Specialty:  Nurse Practitioner   Why:  Appt at 4:00 PM    Contact information:   [address] [address] [phone]       Follow up with Simonne Maffucci, MD On 05/12/2014.   Specialty:  Pulmonary Disease   Why:  Appt at 2 PM    Contact information:   [place] [address] [phone]         Medication List    STOP taking these medications        famotidine 20 MG tablet  Commonly known as:  PEPCID      TAKE these medications        ALPRAZolam 0.25 MG tablet  Commonly known as:  XANAX  Take 1 tablet (0.25 mg total) by mouth 3 (three) times daily as needed for anxiety or sleep.     aspirin 81 MG tablet  Take 81 mg by mouth daily.     brimonidine 0.15 % ophthalmic solution  Commonly known as:  ALPHAGAN  Place 1 drop into the right eye 2 (two) times daily.     dorzolamide-timolol 22.3-6.8 MG/ML  ophthalmic solution  Commonly known as:  COSOPT  Place 1 drop into the right eye 2 (two) times daily.     levothyroxine 150 MCG tablet  Commonly known as:  SYNTHROID, LEVOTHROID  Take 150 mcg by mouth daily before breakfast.     MULTIVITAL PO  Take 1 tablet by mouth daily.     Nintedanib Esylate 150 MG Caps  Commonly known as:  OFEV  One twice daily with food     pantoprazole 40 MG tablet  Commonly known as:  PROTONIX  Take 1 tablet (40 mg total) by mouth daily.     predniSONE 20 MG tablet  Commonly known as:  DELTASONE  Take 3 tablets (60 mg total) by mouth daily with breakfast.     rosuvastatin 10 MG tablet  Commonly known as:  CRESTOR  Take 10 mg by mouth daily.     sodium chloride 0.65 % Soln nasal spray  Commonly known as:  OCEAN  Place 1 spray into both nostrils 2 (two) times daily.     tamsulosin 0.4 MG Caps capsule  Commonly known as:  FLOMAX  Take 0.4 mg by mouth daily.          Disposition:  Home.  Home health RN, RT, Aide and 3 in 1 chairordered.   Discharged Condition: NORMA IGNASIAK has met maximum benefit of inpatient care and is medically stable and cleared for discharge.  Patient is pending follow up as above.      Time spent on disposition:  Greater than 35 minutes.   Signed: Noe Gens, NP-C [facility] Pgr: [phone] Office: [phone]    Baltazar Apo, MD, PhD 03/28/2014, 5:51 PM [facility] [phone] or if no answer [phone]

## 2014-03-28 MED ORDER — ALPRAZOLAM 0.25 MG PO TABS: 0.2500 mg | ORAL_TABLET | Freq: Three times a day (TID) | ORAL | Status: DC | PRN

## 2014-03-28 MED ORDER — SALINE SPRAY 0.65 % NA SOLN: 1.0000 | Freq: Two times a day (BID) | NASAL | Status: AC

## 2014-03-28 MED ORDER — PREDNISONE 20 MG PO TABS: 60.0000 mg | ORAL_TABLET | Freq: Every day | ORAL | Status: DC

## 2014-03-28 MED ORDER — PANTOPRAZOLE SODIUM 40 MG PO TBEC: 40.0000 mg | DELAYED_RELEASE_TABLET | Freq: Every day | ORAL | Status: AC

## 2014-03-28 MED ORDER — PREDNISONE 20 MG PO TABS
60.0000 mg | ORAL_TABLET | Freq: Every day | ORAL | Status: DC
  Administered 2014-03-28: 60 mg via ORAL
  Filled 2014-03-28: qty 3

## 2014-03-28 NOTE — Progress Notes (Signed)
Patient given discharge instructions, explained medications, and follow-up appointments. Patient had no questions. Patient given back DNR form. Patient's IV removed and taken off monitor. Patient comfortable, in no distress and vital signs stable. Family at bedside to assist in transporting home.

## 2014-03-28 NOTE — Evaluation (Signed)
Physical Therapy Evaluation Patient Details Name: Bernard Griffith MRN: [id number] DOB: 1940-03-21 Today's Date: 03/28/2014   History of Present Illness  74 y.o. male with h/o idiopathic pulmonary fibrosis, CAD, prostate cancer admitted with increased dyspnea and diarrhea.   Clinical Impression  *Pt admitted with above diagnosis. Pt currently with functional limitations due to the deficits listed below (see PT Problem List).  Pt will benefit from skilled PT to increase their independence and safety with mobility to allow discharge to the venue listed below.    Bernard Griffith ambulated 32' x 2 with RW and 25L O2, SaO2 85-95% with walking. He would benefit from a 4 wheeled RW for energy conservation and HHPT. **    Follow Up Recommendations Home health PT    Equipment Recommendations  Other (comment) (4 wheeled rolling walker)    Recommendations for Other Services       Precautions / Restrictions Precautions Precaution Comments: monitor O2 Restrictions Weight Bearing Restrictions: No      Mobility  Bed Mobility Overal bed mobility: Modified Independent             General bed mobility comments: with rail  Transfers Overall transfer level: Needs assistance Equipment used: Rolling walker (2 wheeled) Transfers: Sit to/from Stand Sit to Stand: Min guard         General transfer comment: cues for hand placement  Ambulation/Gait Ambulation/Gait assistance: Supervision Ambulation Distance (Feet): 120 Feet (60' x 2 with seated rest break) Assistive device: Rolling walker (2 wheeled) Gait Pattern/deviations: Step-through pattern   Gait velocity interpretation: Below normal speed for age/gender General Gait Details: pt steady with RW, SaO2 sats 85-95% on 15L O2, pt has disk attachment to nasal canula which allows up to 15L per daughter  Stairs            Wheelchair Mobility    Modified Rankin (Stroke Patients Only)       Balance Overall balance assessment:  Modified Independent                                           Pertinent Vitals/Pain Pain Assessment: No/denies pain    Home Living Family/patient expects to be discharged to:: Private residence Living Arrangements: Spouse/significant other Available Help at Discharge: Family;Available 24 hours/day Type of Home: House Home Access: Stairs to enter   [employer] of Steps: 2 Home Layout: One level Home Equipment: None      Prior Function Level of Independence: Independent         Comments: walked without assistive device, sat on commode to do sponge bath     Hand Dominance        Extremity/Trunk Assessment   Upper Extremity Assessment: Overall WFL for tasks assessed           Lower Extremity Assessment: Overall WFL for tasks assessed      Cervical / Trunk Assessment: Normal  Communication   Communication: No difficulties  Cognition Arousal/Alertness: Awake/alert Behavior During Therapy: WFL for tasks assessed/performed Overall Cognitive Status: Within Functional Limits for tasks assessed                      General Comments      Exercises General Exercises - Lower Extremity Ankle Circles/Pumps: AROM;Both;10 reps Quad Sets: AROM;5 reps;Both Gluteal Sets: AROM;Both;5 reps Heel Slides: AROM;Both;5 reps Hip ABduction/ADduction: AROM;Both;5 reps Shoulder Exercises Shoulder Flexion:  AROM;Both;5 reps;Seated      Assessment/Plan    PT Assessment Patient needs continued PT services  PT Diagnosis Generalized weakness   PT Problem List Decreased activity tolerance;Decreased mobility;Cardiopulmonary status limiting activity;Decreased knowledge of use of DME  PT Treatment Interventions Gait training;Therapeutic activities;Patient/family education;Therapeutic exercise   PT Goals (Current goals can be found in the Care Plan section) Acute Rehab PT Goals Patient Stated Goal: to be able to walk farther PT Goal Formulation:  With patient/family Time For Goal Achievement: 04/11/14 Potential to Achieve Goals: Good    Frequency Min 3X/week   Barriers to discharge        Co-evaluation               End of Session Equipment Utilized During Treatment: Gait belt;Oxygen Activity Tolerance: Patient tolerated treatment well Patient left: in chair;with call bell/phone within reach;with family/visitor present Nurse Communication: Mobility status         Time: 1040-1120 PT Time Calculation (min) (ACUTE ONLY): 40 min   Charges:   PT Evaluation $Initial PT Evaluation Tier I: 1 Procedure PT Treatments $Gait Training: 8-22 mins $Therapeutic Exercise: 8-22 mins   PT G Codes:        Philomena Doheny 03/28/2014, 12:11 PM [phone]

## 2014-04-08 ENCOUNTER — Telehealth: Payer: Self-pay

## 2014-04-08 NOTE — Telephone Encounter (Signed)
Received rx request from [facility] on [address]. Pt given Alprazolam .25mg  tabs #30 1 po tid prn on 2/19 with 0 refills by Noe Gens.  This has not been on pt's med list before this.  Dr Lake Bells are you ok with this refill?  Thanks!

## 2014-04-09 MED ORDER — ALPRAZOLAM 0.25 MG PO TABS: 0.2500 mg | ORAL_TABLET | Freq: Three times a day (TID) | ORAL | Status: DC | PRN

## 2014-04-09 NOTE — Telephone Encounter (Signed)
Rx was called in 

## 2014-04-09 NOTE — Telephone Encounter (Signed)
Fine by me 

## 2014-04-10 ENCOUNTER — Telehealth: Payer: Self-pay | Admitting: Adult Health

## 2014-04-10 NOTE — Telephone Encounter (Signed)
Per 03/24/14 D/C w/ Noe Gens: Home health will be coming to visit you for physical therapy, an aide and respiratory ---  Called spoke with Debbie from [facility]. She reports pt never had resp therapist come out and visit. If we still want this, since pt is scheduled to see TP tomorrow then we can re order this then. FYI fro TP/JJ

## 2014-04-11 ENCOUNTER — Telehealth: Payer: Self-pay | Admitting: Pulmonary Disease

## 2014-04-11 ENCOUNTER — Inpatient Hospital Stay: Payer: Medicare Other | Admitting: Adult Health

## 2014-04-11 NOTE — Telephone Encounter (Signed)
Spoke with pt's wife, Vaughan Basta. Advised her that we do not have documentation that BQ has filled these before. Pt should call PCP or eye Dr.

## 2014-04-14 ENCOUNTER — Ambulatory Visit (INDEPENDENT_AMBULATORY_CARE_PROVIDER_SITE_OTHER): Payer: Medicare Other | Admitting: Pulmonary Disease

## 2014-04-14 ENCOUNTER — Encounter: Payer: Self-pay | Admitting: Pulmonary Disease

## 2014-04-14 ENCOUNTER — Telehealth: Payer: Self-pay | Admitting: *Deleted

## 2014-04-14 VITALS — BP 118/62 | HR 71 | Temp 97.0°F | Ht 68.0 in | Wt 160.0 lb

## 2014-04-14 DIAGNOSIS — J9621 Acute and chronic respiratory failure with hypoxia: Secondary | ICD-10-CM

## 2014-04-14 DIAGNOSIS — Z515 Encounter for palliative care: Secondary | ICD-10-CM

## 2014-04-14 DIAGNOSIS — J84112 Idiopathic pulmonary fibrosis: Secondary | ICD-10-CM

## 2014-04-14 MED ORDER — NYSTATIN 100000 UNIT/ML MT SUSP: 5.0000 mL | Freq: Three times a day (TID) | OROMUCOSAL | Status: DC | PRN

## 2014-04-14 MED ORDER — PREDNISONE 20 MG PO TABS: 40.0000 mg | ORAL_TABLET | Freq: Every day | ORAL | Status: DC

## 2014-04-14 MED ORDER — ALPRAZOLAM 0.25 MG PO TABS: 0.2500 mg | ORAL_TABLET | Freq: Three times a day (TID) | ORAL | Status: DC | PRN

## 2014-04-14 NOTE — Progress Notes (Signed)
Subjective:    Patient ID: Bernard Griffith, male    DOB: Oct 18, 1940, 74 y.o.   MRN: [id number]  Synopsis: Idiopathic pulmonary fibrosis followed by Dr. Wynn Maudlin at [facility]. Has been taking Ofev since March 2015. Was turned down for lung transplant by the [facility] transplant team do to age, coronary disease, prostate cancer, and reflux  HPI  Chief Complaint  Patient presents with  . Hospitalization Follow-up    HFU per Brandy. Pt states since being D/C he has been doing well. Pt feels Prednisone has helped a lot. Pt is able to exert self more often.    Bernard Griffith has been using the oxymizer at home which helps some but he continues to use his 25Lpm "two-tank" strategy with a high flow oxygen tube.  He remains focused on his oxygen liter per minute flow rate as well as his O2 saturation.  He has been walking with the physical therapist at home on 15 LPM and he feels OK on that.  He is still taking the prednisone at 60mg  daliy and he says that he feels much better. He is hungry, has more energy and is breathing better.  He is using the Xanax at night which helps him sleep.  He is not taking the Xanax during the day.  He says that he is getting around the house well thanks to OT and PT.    He runs his oxymizer at 12 lpm during the daytime and runs 10lpm at night.  Past Medical History  Diagnosis Date  . Prostate cancer   . Pulmonary fibrosis   . Hypercholesteremia   . Hypothyroidism   . GERD (gastroesophageal reflux disease)      Review of Systems  Constitutional: Positive for fatigue. Negative for fever and chills.  HENT: Negative for postnasal drip, rhinorrhea and sinus pressure.   Respiratory: Positive for shortness of breath. Negative for cough, choking and wheezing.   Cardiovascular: Negative for chest pain, palpitations and leg swelling.       Objective:   Physical Exam  Filed Vitals:   04/14/14 1426  BP: 118/62  Pulse: 71  Temp: 97 F  (36.1 C)  TempSrc: Oral  Height: 5\' 8"  (1.727 m)  Weight: 160 lb (72.576 kg)  SpO2: 98%   15L [place]  Gen: chronically ill appearing, no acute distress HEENT: NCAT, EOMi, OP clear,  CV: RRR, no mgr, no JVD Pulm: Crackles all the way up bilaterally AB: BS+, soft, nontender, Ext: warm, no edema, no clubbing, no cyanosis Derm: no rash or skin breakdown Neuro: A&Ox4, MAEW       Assessment & Plan:   IPF (idiopathic pulmonary fibrosis) Bernard Griffith has had a relative improvement in his symptoms since being treated with high-dose steroids and tapering slowly on prednisone. I believe that we could taper his dose of prednisone down to 40 mg today and maintain that for a few weeks. My plan will be to slowly taper him over the course of the next 2-3 months down to a dose of about 15-20 mg daily.  Bernard Griffith has end-stage idiopathic pulmonary fibrosis and he and his family are reluctant to discuss hospice. I have brought this up with them in so did my team while he was hospitalized at [facility]. He and his family are not ready to have this discussion unfortunately. They did agree to a DO NOT RESUSCITATE status.  Plan: -Continue prednisone 40 mg daily for 1 month, then taper down to  30 mg daily -Follow-up with me in 2 months -Continue efforts to enroll him in hospice -The plan for this year will be to decrease his dose of prednisone down to 20 mg. Once he receives the pirfenidone in the mail I have asked him to stop Ofev for 14 days prior to starting the pirfenidone. -Once he is at maximum dose of pirfenidone we will make attempts to enroll him in clinical trials for idiopathic pulmonary fibrosis   Acute on chronic respiratory failure Bernard Griffith remains very focused on his O2 saturation. I explained to him again today (as my team did at length while hospitalized) that he will remain chronically hypoxemic because of his very severe interstitial lung disease. I encouraged him to remain focus on his symptoms  rather than his O2 saturation number. However, he remains intently focused on the O2 saturation is currently using is much is 25 L of oxygen on exertion and 15 L at rest. I have encouraged him to use the oxygen pendant reservoir device, it sounds like he is using this a bit more.     Updated Medication List Outpatient Encounter Prescriptions as of 04/14/2014  Medication Sig  . ALPRAZolam (XANAX) 0.25 MG tablet Take 1 tablet (0.25 mg total) by mouth 3 (three) times daily as needed for anxiety or sleep.  Marland Kitchen aspirin 81 MG tablet Take 81 mg by mouth daily.  . brimonidine (ALPHAGAN) 0.15 % ophthalmic solution Place 1 drop into the right eye 2 (two) times daily.  . dorzolamide-timolol (COSOPT) 22.3-6.8 MG/ML ophthalmic solution Place 1 drop into the right eye 2 (two) times daily.  Marland Kitchen levothyroxine (SYNTHROID, LEVOTHROID) 150 MCG tablet Take 150 mcg by mouth daily before breakfast.  . Multiple Vitamins-Minerals (MULTIVITAL PO) Take 1 tablet by mouth daily.  . Nintedanib Esylate (OFEV) 150 MG CAPS One twice daily with food (Patient taking differently: Take 150 mg by mouth 2 (two) times daily. )  . pantoprazole (PROTONIX) 40 MG tablet Take 1 tablet (40 mg total) by mouth daily.  . predniSONE (DELTASONE) 20 MG tablet Take 2 tablets (40 mg total) by mouth daily with breakfast.  . rosuvastatin (CRESTOR) 10 MG tablet Take 40mg  on MWF, and 20mg  other days  . sodium chloride (OCEAN) 0.65 % SOLN nasal spray Place 1 spray into both nostrils 2 (two) times daily.  . tamsulosin (FLOMAX) 0.4 MG CAPS capsule Take 0.4 mg by mouth daily.  . [DISCONTINUED] ALPRAZolam (XANAX) 0.25 MG tablet Take 1 tablet (0.25 mg total) by mouth 3 (three) times daily as needed for anxiety or sleep.  . [DISCONTINUED] predniSONE (DELTASONE) 20 MG tablet Take 3 tablets (60 mg total) by mouth daily with breakfast.  . nystatin (MYCOSTATIN) 100000 UNIT/ML suspension Take 5 mLs (500,000 Units total) by mouth 3 (three) times daily as needed  (thrush).

## 2014-04-14 NOTE — Patient Instructions (Signed)
Decrease the prednisone to 40mg  daily and remain at that dose When you receive the pirfenidone, stop taking the Ofev.  After 14 days, start taking the pirfenidone at 1 pill three times a day and then increase the dose as directed Use the nystatin swish and spit mouthwash 2-3 times per day as needed until the thrush goes a way Use the Oxygen pendant for your oxygen We will see you back in 6-8 weeks or sooner if needed

## 2014-04-14 NOTE — Telephone Encounter (Signed)
PA form for OFEV has been received and placed in Dr. Lake Bells look at for signature.  Will forward to [place] to f/u on.

## 2014-04-15 NOTE — Assessment & Plan Note (Addendum)
Bernard Griffith has had a relative improvement in his symptoms since being treated with high-dose steroids and tapering slowly on prednisone. I believe that we could taper his dose of prednisone down to 40 mg today and maintain that for a few weeks. My plan will be to slowly taper him over the course of the next 2-3 months down to a dose of about 15-20 mg daily.  Bernard Griffith has end-stage idiopathic pulmonary fibrosis and he and his family are reluctant to discuss hospice. I have brought this up with them in so did my team while he was hospitalized at [facility]. He and his family are not ready to have this discussion unfortunately. They did agree to a DO NOT RESUSCITATE status.  Plan: -Continue prednisone 40 mg daily for 1 month, then taper down to 30 mg daily -Follow-up with me in 2 months -Continue efforts to enroll him in hospice -The plan for this year will be to decrease his dose of prednisone down to 20 mg. Once he receives the pirfenidone in the mail I have asked him to stop Ofev for 14 days prior to starting the pirfenidone. -Once he is at maximum dose of pirfenidone we will make attempts to enroll him in clinical trials for idiopathic pulmonary fibrosis

## 2014-04-15 NOTE — Assessment & Plan Note (Signed)
Bernard Griffith remains very focused on his O2 saturation. I explained to him again today (as my team did at length while hospitalized) that he will remain chronically hypoxemic because of his very severe interstitial lung disease. I encouraged him to remain focus on his symptoms rather than his O2 saturation number. However, he remains intently focused on the O2 saturation is currently using is much is 25 L of oxygen on exertion and 15 L at rest. I have encouraged him to use the oxygen pendant reservoir device, it sounds like he is using this a bit more.

## 2014-04-16 ENCOUNTER — Telehealth: Payer: Self-pay | Admitting: Pulmonary Disease

## 2014-04-16 NOTE — Telephone Encounter (Signed)
Pt's wife is aware of KC's recommendations.

## 2014-04-16 NOTE — Telephone Encounter (Signed)
Patient woke up with sore throat this morning and as the day progressed, chest is getting tight.  No cough.  No fever. Patient is having a lot of diarrhea and would also like a prescription for Imodium.    Sent to Dr. Gwenette Greet in Dr. Anastasia Pall absence.

## 2014-04-16 NOTE — Telephone Encounter (Signed)
Duplicate message was created. Will close this one.

## 2014-04-16 NOTE — Telephone Encounter (Signed)
Attempted to call pt x2. Someone picks up the phone then the line goes dead. Will try back.

## 2014-04-16 NOTE — Telephone Encounter (Signed)
immodium is over the counter, but would be hesitant to take for a viral gastroenteritis in the acute period.

## 2014-04-17 NOTE — Telephone Encounter (Signed)
This has been filled out and faxed back.

## 2014-04-18 ENCOUNTER — Telehealth: Payer: Self-pay | Admitting: Pulmonary Disease

## 2014-04-18 NOTE — Telephone Encounter (Signed)
LM for [place], [facility] to return call

## 2014-04-18 NOTE — Telephone Encounter (Signed)
Spoke with [facility], today was supposed to be the patient's last day of home health.  Katlyn and patient are requesting that this be extended for a longer period of time.  Wife is also requesting that a social work order be placed as well.  Katlyn aware that Dr Lake Bells is off until Wednesday - this is fine per [facility], just be aware that she will not be able to see the patient again until this order is extended.  Corbin Ade states that she saw patient today and he is doing good, should be fine until next visit.   Ok to leave a detailed message on voicemail along with verbal orders when call back.   Please advise Dr Lake Bells. Thanks.

## 2014-04-18 NOTE — Telephone Encounter (Signed)
PA form received and the OFEV has been approved from 03/10/2014 through 02/07/2015.  Will forward to [place] to let her know this has been approved.

## 2014-04-18 NOTE — Telephone Encounter (Signed)
Return [name] °

## 2014-04-22 ENCOUNTER — Telehealth: Payer: Self-pay | Admitting: Pulmonary Disease

## 2014-04-22 MED ORDER — BENZONATATE 200 MG PO CAPS: 200.0000 mg | ORAL_CAPSULE | Freq: Three times a day (TID) | ORAL | Status: AC | PRN

## 2014-04-22 NOTE — Telephone Encounter (Signed)
OK by me to order both of these

## 2014-04-22 NOTE — Telephone Encounter (Signed)
Spoke with pt. Reports dry cough x1 week. Has been using Mucinex with no relief. Would like something called in.  BQ - please advise. Thanks.

## 2014-04-22 NOTE — Telephone Encounter (Signed)
Tessalon 200mg  po tid prn cough Dispense #50, refill 1 Instruct him to come to clinic if worsening dyspnea, fever, or mucus production

## 2014-04-22 NOTE — Telephone Encounter (Signed)
lmomtcb x1 for [employer]

## 2014-04-22 NOTE — Telephone Encounter (Signed)
Pt aware of recs. RX sent in. Nothing further needed 

## 2014-04-23 NOTE — Telephone Encounter (Signed)
Left detailed message at [facility] request giving verbal order to extend home health visits as well as a social work order, with request to call back if anything further is needed.  Will close message.

## 2014-05-05 ENCOUNTER — Telehealth: Payer: Self-pay | Admitting: Pulmonary Disease

## 2014-05-05 NOTE — Telephone Encounter (Signed)
Called and spoke to pt's wife, Bernard Griffith. Pt requesting Hydromet for persistent cough. Hydromet has never been prescribed by BQ per pt's chart. Renato Gails that BQ is unavailable and we would have to ask another provider. Bernard Griffith stated she would ask pt's PCP as he was the last to prescribe this for the pt. Bernard Griffith stated she would call back if PCP would not prescribe the Hydromet. Nothing further needed at this time.

## 2014-05-08 ENCOUNTER — Telehealth: Payer: Self-pay | Admitting: Pulmonary Disease

## 2014-05-08 MED ORDER — NYSTATIN 100000 UNIT/ML MT SUSP: 5.0000 mL | Freq: Three times a day (TID) | OROMUCOSAL | Status: AC | PRN

## 2014-05-08 MED ORDER — HYDROCODONE-HOMATROPINE 5-1.5 MG/5ML PO SYRP: 5.0000 mL | ORAL_SOLUTION | Freq: Four times a day (QID) | ORAL | Status: AC | PRN

## 2014-05-08 MED ORDER — ALPRAZOLAM 0.25 MG PO TABS: 0.2500 mg | ORAL_TABLET | Freq: Three times a day (TID) | ORAL | Status: AC | PRN

## 2014-05-08 NOTE — Telephone Encounter (Signed)
Rxs were printed, signed by CDY and placed up front  Pt's spouse aware  I refilled nystatin per her requesting Nothing further needed

## 2014-05-08 NOTE — Telephone Encounter (Signed)
Ok as requested. The hydromet would be 140 ml,   5 ml every 6 hours as needed for cough

## 2014-05-08 NOTE — Telephone Encounter (Signed)
Spoke with the pt's spouse  She states that she pt needs refill on alprazolam  Dr Lake Bells prescribed this on 04/14/14 # 64 with 2 refills (records state it was phoned in) Spouse says that he never got rx and I called pharm and confirmed they never got a call from [place]  Spouse is also requesting rx for hydromet  She reports that the pt has non prod cough, no worse since his last ov here 04/14/14  Dr Lake Bells on vacation so will forward to doc of the day  Dr Annamaria Boots, please advise, thanks! No Known Allergies Current Outpatient Prescriptions on File Prior to Visit  Medication Sig Dispense Refill  . ALPRAZolam (XANAX) 0.25 MG tablet Take 1 tablet (0.25 mg total) by mouth 3 (three) times daily as needed for anxiety or sleep. 45 tablet 2  . aspirin 81 MG tablet Take 81 mg by mouth daily.    . benzonatate (TESSALON) 200 MG capsule Take 1 capsule (200 mg total) by mouth 3 (three) times daily as needed for cough. 50 capsule 1  . brimonidine (ALPHAGAN) 0.15 % ophthalmic solution Place 1 drop into the right eye 2 (two) times daily.    . dorzolamide-timolol (COSOPT) 22.3-6.8 MG/ML ophthalmic solution Place 1 drop into the right eye 2 (two) times daily.    Marland Kitchen levothyroxine (SYNTHROID, LEVOTHROID) 150 MCG tablet Take 150 mcg by mouth daily before breakfast.    . Multiple Vitamins-Minerals (MULTIVITAL PO) Take 1 tablet by mouth daily.    . Nintedanib Esylate (OFEV) 150 MG CAPS One twice daily with food (Patient taking differently: Take 150 mg by mouth 2 (two) times daily. ) 60 capsule 11  . nystatin (MYCOSTATIN) 100000 UNIT/ML suspension Take 5 mLs (500,000 Units total) by mouth 3 (three) times daily as needed (thrush). 60 mL 0  . pantoprazole (PROTONIX) 40 MG tablet Take 1 tablet (40 mg total) by mouth daily. 30 tablet 3  . predniSONE (DELTASONE) 20 MG tablet Take 2 tablets (40 mg total) by mouth daily with breakfast. 60 tablet 2  . rosuvastatin (CRESTOR) 10 MG tablet Take 40mg  on MWF, and 20mg  other days    . sodium  chloride (OCEAN) 0.65 % SOLN nasal spray Place 1 spray into both nostrils 2 (two) times daily.  0  . tamsulosin (FLOMAX) 0.4 MG CAPS capsule Take 0.4 mg by mouth daily.     No current facility-administered medications on file prior to visit.

## 2014-05-12 ENCOUNTER — Ambulatory Visit: Payer: Medicare Other | Admitting: Pulmonary Disease

## 2014-05-15 ENCOUNTER — Telehealth: Payer: Self-pay | Admitting: Pulmonary Disease

## 2014-05-15 NOTE — Telephone Encounter (Signed)
Called spoke with Verline Lema [facility]). She reports pt has 1 more nurse visit with them and would be d/c'd. She reports they have spoken about hospice but reports the family is working on this. I advised we have no placed an order for hospice, so not sure who did the referral then. She reports she will call hospice and go from there before we ask Dr. Lake Bells if he still wants pt to have nurse visit. She will call tomorrow. Nothing further needed

## 2014-05-16 ENCOUNTER — Telehealth: Payer: Self-pay | Admitting: Pulmonary Disease

## 2014-05-16 NOTE — Telephone Encounter (Signed)
[facility] closed, will call tomorrow.

## 2014-05-19 ENCOUNTER — Telehealth: Payer: Self-pay | Admitting: Pulmonary Disease

## 2014-05-19 NOTE — Telephone Encounter (Signed)
lmtcb x2 for [facility]

## 2014-05-19 NOTE — Telephone Encounter (Signed)
Spoke with hospice nurse, wants to know if BQ will be attending if pt is admitted to hospice.  Nurse states that symptom mgmt could be up to BQ or the hospice docs, whatever he prefers.    Dr. Lake Bells would you be ok with being the attending?  Thanks!

## 2014-05-19 NOTE — Telephone Encounter (Signed)
Hospice called back his pcp will admit him to hospice nothing further needed

## 2014-05-20 NOTE — Telephone Encounter (Signed)
lmtcb X3 for [facility]

## 2014-05-20 NOTE — Telephone Encounter (Signed)
The patient and his wife refused hospice during his most recent admission.  We need to clarify if this is just an old order or if they have changed their minds.

## 2014-05-21 ENCOUNTER — Telehealth: Payer: Self-pay | Admitting: Pulmonary Disease

## 2014-05-21 NOTE — Telephone Encounter (Signed)
OK, got it. thanks

## 2014-05-21 NOTE — Telephone Encounter (Signed)
Spoke with Verline Lema at [facility], states that pt is now under hospice care and just letting us know that because of this, home health visits through [facility] will cease.  It looks like pt's PCP Dr. Alroy Dust is pt's attending physician with hospice.  Dr. Lake Bells sending just as an fyi.  Nothing further needed.

## 2014-05-21 NOTE — Telephone Encounter (Signed)
LMTCB and will close per triage protocol 

## 2014-05-22 ENCOUNTER — Other Ambulatory Visit: Payer: Self-pay

## 2014-05-22 ENCOUNTER — Telehealth: Payer: Self-pay | Admitting: Pulmonary Disease

## 2014-05-22 MED ORDER — HYDROCODONE-HOMATROPINE 5-1.5 MG/5ML PO SYRP: 5.0000 mL | ORAL_SOLUTION | Freq: Four times a day (QID) | ORAL | Status: AC | PRN

## 2014-05-22 NOTE — Telephone Encounter (Signed)
Called and spoke to Herron with Hospice. Informed her the refill per BQ.   CY please advise if ok for you to sign off on rx as BQ is out office till next week. If so please advise how much of med to fill. Thanks.

## 2014-05-22 NOTE — Telephone Encounter (Signed)
Called Carlos with Hospice back, states that the patient is requesting for Dr Lake Bells to be his Primary Physician in symptom management.  Pt is not wanting Dr Mitchell(his PCP) to be his attending physician over his care.   Clifton James states that the patient has no nebulizer meds and no rescue inhalers. Requesting Rx's (verbal) for all if able.   Please advise on the above Dr Lake Bells. Thanks.

## 2014-05-22 NOTE — Telephone Encounter (Signed)
Spoke with Bernard Griffith, needs hydromet rx faxed to pharmacy- this has been done.  Dr. Lake Bells, pt is not on any rescue nebulizers.  Can you advise on what neb you want the pt to be on?  Pt does not have a neb machine, but hospice can order this for pt.

## 2014-05-22 NOTE — Telephone Encounter (Signed)
Carlos calling back pt needs refill on hydro met 5-1.43m per  565mtimes every 6 hours for cough send to [facility] [address]

## 2014-05-22 NOTE — Telephone Encounter (Signed)
Ok - 200 ml

## 2014-05-22 NOTE — Telephone Encounter (Signed)
Yes of course, I'm happy to take him  Please send Rx for hydromet

## 2014-05-26 ENCOUNTER — Encounter: Payer: Self-pay | Admitting: Pulmonary Disease

## 2014-05-26 ENCOUNTER — Ambulatory Visit (INDEPENDENT_AMBULATORY_CARE_PROVIDER_SITE_OTHER): Payer: Medicare Other | Admitting: Pulmonary Disease

## 2014-05-26 VITALS — BP 134/74 | HR 85

## 2014-05-26 DIAGNOSIS — Z66 Do not resuscitate: Secondary | ICD-10-CM | POA: Diagnosis not present

## 2014-05-26 DIAGNOSIS — J84112 Idiopathic pulmonary fibrosis: Secondary | ICD-10-CM | POA: Diagnosis not present

## 2014-05-26 DIAGNOSIS — R0902 Hypoxemia: Secondary | ICD-10-CM

## 2014-05-26 MED ORDER — MORPHINE SULFATE 20 MG/5ML PO SOLN: 2.5000 mg | ORAL | Status: AC | PRN

## 2014-05-26 MED ORDER — ALBUTEROL SULFATE (2.5 MG/3ML) 0.083% IN NEBU: 2.5000 mg | INHALATION_SOLUTION | Freq: Four times a day (QID) | RESPIRATORY_TRACT | Status: AC | PRN

## 2014-05-26 NOTE — Progress Notes (Signed)
Subjective:    Patient ID: Bernard Griffith, male    DOB: Oct 02, 1940, 74 y.o.   MRN: [id number]  Synopsis: Idiopathic pulmonary fibrosis followed by Dr. Wynn Maudlin at [facility]. Has been taking Ofev since March 2015. Was turned down for lung transplant by the [facility] transplant team do to age, coronary disease, prostate cancer, and reflux  HPI  Chief Complaint  Patient presents with  . Follow-up    pt c/o stable sob, occasional nonprod cough.     Dr. Dennard Nip office contacted Alvester Chou last week to let him know that the Ofev wasn't really doing him any good.  He has really been struggling with more dyspnea lately even just walking from one room to the next. He is requiring help with showering and they are coming to the house to help.  He is not taking any morphine.  He is happy with the hospice services so far.  He does not have a significant cough. He has not yet stopped taking that Ofev. He has been experiencing some symptoms of depression as has his wife. However, he seems to have come to grips with the fact that he has end-stage pulmonary fibrosis. He continues to use his oxygen on a regular basis. He has been receiving assistance with showering which helps quite a bit. No leg swelling or chest pain.  Past Medical History  Diagnosis Date  . Prostate cancer   . Pulmonary fibrosis   . Hypercholesteremia   . Hypothyroidism   . GERD (gastroesophageal reflux disease)      Review of Systems  Constitutional: Positive for fatigue. Negative for fever and chills.  HENT: Negative for postnasal drip, rhinorrhea and sinus pressure.   Respiratory: Positive for shortness of breath. Negative for cough, choking and wheezing.   Cardiovascular: Negative for chest pain, palpitations and leg swelling.       Objective:   Physical Exam  Filed Vitals:   05/26/14 1458  BP: 134/74  Pulse: 85  SpO2: 92%   15L [place]  Gen: chronically ill appearing, no acute  distress HEENT: NCAT, EOMi, OP clear,  CV: RRR, no mgr, no JVD Pulm: Crackles all the way up bilaterally AB: BS+, soft, nontender, Ext: warm, no edema, no clubbing, no cyanosis Derm: no rash or skin breakdown Neuro: A&Ox4, MAEW  Recent notes from the hospice team were reviewed where he was enrolled in hospice last week Records from Dr. Raynelle Bring Morrison's office at [facility] were reviewed were hospice was recommended     Assessment & Plan:   IPF (idiopathic pulmonary fibrosis) I spent an extensive amount of time today going over his diagnosis of idiopathic pulmonary fibrosis once again. Even know they started hospice last week they still have questions about whether or not he could be treated with an investigational agent. After lengthy discussion and explanation that his symptoms are end-stage and there is currently no improvement there be which will cause any benefit in terms of disease modification he and his wife both understand that there is no role for further anti-fibrotic therapy.  His symptoms have become severe to the point where he cannot walk through the house without dyspnea. After lengthy discussion about the use of morphine for symptom relief he states that he is willing to try it.  Plan: -I will be his hospice primary physician -Start morphine 2.5-5 mg liquid every 4 hours as needed for shortness of breath, this will be coordinated with his hospice nurse -Hospice nursing has  recommended albuterol, I don't think this will help much because he does not have airways disease but we can try to see if it gives him some symptom relief -Follow-up 6-8 weeks   Exercise hypoxemia This continues to be the primary focus for him but it seems that he is starting to focus more on his symptoms rather than his O2 saturation finally. He was encouraged to maintain oxygen use with all activities including showering. Again, I encouraged him to pay more attention to his symptoms rather than to  the oxygen saturation.  Plan: -Continue O2 supplemental primarily for symptom relief   DNR (do not resuscitate) This has been confirmed again that his CODE STATUS is DO NOT RESUSCITATE.     Updated Medication List Outpatient Encounter Prescriptions as of 05/26/2014  Medication Sig  . ALPRAZolam (XANAX) 0.25 MG tablet Take 1 tablet (0.25 mg total) by mouth 3 (three) times daily as needed for anxiety or sleep.  Marland Kitchen aspirin 81 MG tablet Take 81 mg by mouth daily.  . benzonatate (TESSALON) 200 MG capsule Take 1 capsule (200 mg total) by mouth 3 (three) times daily as needed for cough.  . brimonidine (ALPHAGAN) 0.15 % ophthalmic solution Place 1 drop into the right eye 2 (two) times daily.  . dorzolamide-timolol (COSOPT) 22.3-6.8 MG/ML ophthalmic solution Place 1 drop into the right eye 2 (two) times daily.  Marland Kitchen HYDROcodone-homatropine (HYCODAN) 5-1.5 MG/5ML syrup Take 5 mLs by mouth every 6 (six) hours as needed for cough.  Marland Kitchen HYDROcodone-homatropine (HYCODAN) 5-1.5 MG/5ML syrup Take 5 mLs by mouth every 6 (six) hours as needed for cough.  . levothyroxine (SYNTHROID, LEVOTHROID) 150 MCG tablet Take 150 mcg by mouth daily before breakfast.  . Multiple Vitamins-Minerals (MULTIVITAL PO) Take 1 tablet by mouth daily.  . Nintedanib Esylate (OFEV) 150 MG CAPS One twice daily with food (Patient taking differently: Take 150 mg by mouth 2 (two) times daily. )  . nystatin (MYCOSTATIN) 100000 UNIT/ML suspension Take 5 mLs (500,000 Units total) by mouth 3 (three) times daily as needed (thrush).  . pantoprazole (PROTONIX) 40 MG tablet Take 1 tablet (40 mg total) by mouth daily.  . predniSONE (DELTASONE) 20 MG tablet Take 2 tablets (40 mg total) by mouth daily with breakfast.  . rosuvastatin (CRESTOR) 10 MG tablet Take 40mg  on MWF, and 20mg  other days  . sodium chloride (OCEAN) 0.65 % SOLN nasal spray Place 1 spray into both nostrils 2 (two) times daily.  . tamsulosin (FLOMAX) 0.4 MG CAPS capsule Take 0.4 mg  by mouth daily.  Marland Kitchen albuterol (PROVENTIL) (2.5 MG/3ML) 0.083% nebulizer solution Take 3 mLs (2.5 mg total) by nebulization every 6 (six) hours as needed for wheezing or shortness of breath.  . morphine 20 MG/5ML solution Take 0.6-1.3 mLs (2.4-5.2 mg total) by mouth every 4 (four) hours as needed (shortness of breath).

## 2014-05-26 NOTE — Telephone Encounter (Signed)
lmtcb x1 for [employer].

## 2014-05-26 NOTE — Telephone Encounter (Signed)
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

## 2014-05-26 NOTE — Patient Instructions (Signed)
Take the morphine every four hours as needed for shortness of breath 2.5-5mg   Take the albuterol inhalers every 4-6 hours as needed for shortness of breath We will see you back in 6-8 weeks or sooner if needed, overbook OK

## 2014-05-27 ENCOUNTER — Other Ambulatory Visit: Payer: Self-pay | Admitting: *Deleted

## 2014-05-27 ENCOUNTER — Telehealth: Payer: Self-pay | Admitting: Pulmonary Disease

## 2014-05-27 ENCOUNTER — Other Ambulatory Visit: Payer: Self-pay | Admitting: Pulmonary Disease

## 2014-05-27 MED ORDER — PREDNISONE 20 MG PO TABS: 40.0000 mg | ORAL_TABLET | Freq: Every day | ORAL | Status: AC

## 2014-05-27 MED ORDER — MORPHINE SULFATE (CONCENTRATE) 20 MG/ML PO SOLN: 2.5000 mg | ORAL | Status: AC | PRN

## 2014-05-27 NOTE — Assessment & Plan Note (Signed)
I spent an extensive amount of time today going over his diagnosis of idiopathic pulmonary fibrosis once again. Even know they started hospice last week they still have questions about whether or not he could be treated with an investigational agent. After lengthy discussion and explanation that his symptoms are end-stage and there is currently no improvement there be which will cause any benefit in terms of disease modification he and his wife both understand that there is no role for further anti-fibrotic therapy.  His symptoms have become severe to the point where he cannot walk through the house without dyspnea. After lengthy discussion about the use of morphine for symptom relief he states that he is willing to try it.  Plan: -I will be his hospice primary physician -Start morphine 2.5-5 mg liquid every 4 hours as needed for shortness of breath, this will be coordinated with his hospice nurse -Hospice nursing has recommended albuterol, I don't think this will help much because he does not have airways disease but we can try to see if it gives him some symptom relief -Follow-up 6-8 weeks

## 2014-05-27 NOTE — Assessment & Plan Note (Signed)
This has been confirmed again that his CODE STATUS is DO NOT RESUSCITATE.

## 2014-05-27 NOTE — Telephone Encounter (Signed)
Rx printed and placed in BQ's [place] made aware Will forward to [facility] to document once he has signed and then triage can fax [place] Thanks!

## 2014-05-27 NOTE — Telephone Encounter (Signed)
OK by me, but important to note that the dose should be 2.5-5mg  po q4h prn dyspnea

## 2014-05-27 NOTE — Assessment & Plan Note (Signed)
This continues to be the primary focus for him but it seems that he is starting to focus more on his symptoms rather than his O2 saturation finally. He was encouraged to maintain oxygen use with all activities including showering. Again, I encouraged him to pay more attention to his symptoms rather than to the oxygen saturation.  Plan: -Continue O2 supplemental primarily for symptom relief

## 2014-05-27 NOTE — Telephone Encounter (Signed)
Received fax from [facility] for refill on Prednisone 20mg  3tabs daily as prescribed by Noe Gens NP at the 03/2014 hospital discharge At the 3.7.16 HFU w/ BQ the prednisone was decreased to 40mg  daily Prednisone 40mg  was refilled at that visit #60 with 2 additional refills and no changes were made at the 4.18.16 ov w/ BQ  Will send additional refills to [facility] Nothing further needed, will sign off.

## 2014-05-27 NOTE — Telephone Encounter (Signed)
Spoke with Bernard Griffith at [facility], states that pt's morphine was written for 20mg /38mL rx, but the sig was written to match 20mg /55mL rx.  Per Bernard Griffith this needs to be reprinted and faxed to [facility] with "Hospice patient" written on top of rx.    Dr. Lake Bells are you ok with this change?  The rx for morphine was not filled yesterday d/t pharmacy not having a 20/5 in house.  Thanks!

## 2014-05-27 NOTE — Telephone Encounter (Signed)
Rx signed and faxed to [facility]

## 2014-05-27 NOTE — Telephone Encounter (Signed)
Error

## 2014-05-27 NOTE — Telephone Encounter (Signed)
Spoke with Bernard Griffith Aware that the patient was given an Albuterol Nebulizer script at his 05/26/14 appt. Sent to [facility]. Nothing further needed.

## 2014-06-01 ENCOUNTER — Telehealth: Payer: Self-pay | Admitting: Critical Care Medicine

## 2014-06-01 ENCOUNTER — Emergency Department (HOSPITAL_COMMUNITY)

## 2014-06-01 ENCOUNTER — Inpatient Hospital Stay (HOSPITAL_COMMUNITY)

## 2014-06-01 ENCOUNTER — Encounter (HOSPITAL_COMMUNITY): Payer: Self-pay | Admitting: Emergency Medicine

## 2014-06-01 ENCOUNTER — Telehealth: Payer: Self-pay | Admitting: Pulmonary Disease

## 2014-06-01 ENCOUNTER — Inpatient Hospital Stay (HOSPITAL_COMMUNITY)
Admission: EM | Admit: 2014-06-01 | Discharge: 2014-06-08 | DRG: 872 | Disposition: E | Attending: Internal Medicine | Admitting: Internal Medicine

## 2014-06-01 DIAGNOSIS — Z515 Encounter for palliative care: Secondary | ICD-10-CM | POA: Diagnosis not present

## 2014-06-01 DIAGNOSIS — N451 Epididymitis: Secondary | ICD-10-CM | POA: Diagnosis present

## 2014-06-01 DIAGNOSIS — Z66 Do not resuscitate: Secondary | ICD-10-CM | POA: Diagnosis present

## 2014-06-01 DIAGNOSIS — J84112 Idiopathic pulmonary fibrosis: Secondary | ICD-10-CM | POA: Diagnosis present

## 2014-06-01 DIAGNOSIS — E039 Hypothyroidism, unspecified: Secondary | ICD-10-CM | POA: Diagnosis present

## 2014-06-01 DIAGNOSIS — E871 Hypo-osmolality and hyponatremia: Secondary | ICD-10-CM | POA: Diagnosis not present

## 2014-06-01 DIAGNOSIS — A419 Sepsis, unspecified organism: Principal | ICD-10-CM | POA: Diagnosis present

## 2014-06-01 DIAGNOSIS — J9611 Chronic respiratory failure with hypoxia: Secondary | ICD-10-CM | POA: Diagnosis not present

## 2014-06-01 DIAGNOSIS — Z7982 Long term (current) use of aspirin: Secondary | ICD-10-CM

## 2014-06-01 DIAGNOSIS — Z9981 Dependence on supplemental oxygen: Secondary | ICD-10-CM

## 2014-06-01 DIAGNOSIS — E861 Hypovolemia: Secondary | ICD-10-CM | POA: Diagnosis present

## 2014-06-01 DIAGNOSIS — Z8546 Personal history of malignant neoplasm of prostate: Secondary | ICD-10-CM | POA: Diagnosis not present

## 2014-06-01 DIAGNOSIS — IMO0002 Reserved for concepts with insufficient information to code with codable children: Secondary | ICD-10-CM

## 2014-06-01 DIAGNOSIS — N50812 Left testicular pain: Secondary | ICD-10-CM

## 2014-06-01 DIAGNOSIS — Z8249 Family history of ischemic heart disease and other diseases of the circulatory system: Secondary | ICD-10-CM

## 2014-06-01 DIAGNOSIS — E780 Pure hypercholesterolemia: Secondary | ICD-10-CM | POA: Diagnosis present

## 2014-06-01 DIAGNOSIS — Z87891 Personal history of nicotine dependence: Secondary | ICD-10-CM | POA: Diagnosis not present

## 2014-06-01 DIAGNOSIS — R0600 Dyspnea, unspecified: Secondary | ICD-10-CM | POA: Diagnosis not present

## 2014-06-01 LAB — I-STAT CG4 LACTIC ACID, ED: LACTIC ACID, VENOUS: 2.79 mmol/L — AB (ref 0.5–2.0)

## 2014-06-01 LAB — URINE MICROSCOPIC-ADD ON

## 2014-06-01 LAB — BASIC METABOLIC PANEL
ANION GAP: 6 (ref 5–15)
BUN: 22 mg/dL (ref 6–23)
CO2: 24 mmol/L (ref 19–32)
Calcium: 8.9 mg/dL (ref 8.4–10.5)
Chloride: 103 mmol/L (ref 96–112)
Creatinine, Ser: 0.98 mg/dL (ref 0.50–1.35)
GFR calc Af Amer: >90 mL/min (ref >90)
GFR, EST NON AFRICAN AMERICAN: 79 mL/min — AB (ref >90)
GLUCOSE: 92 mg/dL (ref 70–99)
POTASSIUM: 4.5 mmol/L (ref 3.5–5.1)
Sodium: 133 mmol/L — ABNORMAL LOW (ref 135–145)

## 2014-06-01 LAB — COMPREHENSIVE METABOLIC PANEL
ALT: 228 U/L — ABNORMAL HIGH (ref 0–53)
AST: 87 U/L — AB (ref 0–37)
Albumin: 3.3 g/dL — ABNORMAL LOW (ref 3.5–5.2)
Alkaline Phosphatase: 184 U/L — ABNORMAL HIGH (ref 39–117)
Anion gap: 6 (ref 5–15)
BILIRUBIN TOTAL: 2.4 mg/dL — AB (ref 0.3–1.2)
BUN: 23 mg/dL (ref 6–23)
CO2: 23 mmol/L (ref 19–32)
Calcium: 8 mg/dL — ABNORMAL LOW (ref 8.4–10.5)
Chloride: 108 mmol/L (ref 96–112)
Creatinine, Ser: 1.1 mg/dL (ref 0.50–1.35)
GFR calc Af Amer: 74 mL/min — ABNORMAL LOW (ref >90)
GFR calc non Af Amer: 64 mL/min — ABNORMAL LOW (ref >90)
Glucose, Bld: 108 mg/dL — ABNORMAL HIGH (ref 70–99)
Potassium: 4.9 mmol/L (ref 3.5–5.1)
SODIUM: 137 mmol/L (ref 135–145)
TOTAL PROTEIN: 5.6 g/dL — AB (ref 6.0–8.3)

## 2014-06-01 LAB — URINALYSIS, ROUTINE W REFLEX MICROSCOPIC
Bilirubin Urine: NEGATIVE
Bilirubin Urine: NEGATIVE
GLUCOSE, UA: NEGATIVE mg/dL
Glucose, UA: NEGATIVE mg/dL
KETONES UR: NEGATIVE mg/dL
KETONES UR: NEGATIVE mg/dL
NITRITE: POSITIVE — AB
Nitrite: NEGATIVE
PROTEIN: 100 mg/dL — AB
Protein, ur: 100 mg/dL — AB
SPECIFIC GRAVITY, URINE: 1.042 — AB (ref 1.005–1.030)
Specific Gravity, Urine: 1.025 (ref 1.005–1.030)
UROBILINOGEN UA: 0.2 mg/dL (ref 0.0–1.0)
Urobilinogen, UA: 0.2 mg/dL (ref 0.0–1.0)
pH: 5.5 (ref 5.0–8.0)
pH: 5 (ref 5.0–8.0)

## 2014-06-01 LAB — CBC WITH DIFFERENTIAL/PLATELET
BASOS ABS: 0 10*3/uL (ref 0.0–0.1)
BASOS PCT: 0 % (ref 0–1)
Basophils Absolute: 0 10*3/uL (ref 0.0–0.1)
Basophils Relative: 0 % (ref 0–1)
EOS PCT: 0 % (ref 0–5)
Eosinophils Absolute: 0 10*3/uL (ref 0.0–0.7)
Eosinophils Absolute: 0 10*3/uL (ref 0.0–0.7)
Eosinophils Relative: 0 % (ref 0–5)
HEMATOCRIT: 47.6 % (ref 39.0–52.0)
HEMATOCRIT: 45.8 % (ref 39.0–52.0)
Hemoglobin: 15.8 g/dL (ref 13.0–17.0)
Hemoglobin: 14.8 g/dL (ref 13.0–17.0)
Lymphocytes Relative: 11 % — ABNORMAL LOW (ref 12–46)
Lymphocytes Relative: 10 % — ABNORMAL LOW (ref 12–46)
Lymphs Abs: 2.6 10*3/uL (ref 0.7–4.0)
Lymphs Abs: 2.7 10*3/uL (ref 0.7–4.0)
MCH: 29.9 pg (ref 26.0–34.0)
MCH: 29.7 pg (ref 26.0–34.0)
MCHC: 33.2 g/dL (ref 30.0–36.0)
MCHC: 32.3 g/dL (ref 30.0–36.0)
MCV: 90.2 fL (ref 78.0–100.0)
MCV: 92 fL (ref 78.0–100.0)
MONOS PCT: 6 % (ref 3–12)
Monocytes Absolute: 1.4 10*3/uL — ABNORMAL HIGH (ref 0.1–1.0)
Monocytes Absolute: 1.6 10*3/uL — ABNORMAL HIGH (ref 0.1–1.0)
Monocytes Relative: 6 % (ref 3–12)
NEUTROS PCT: 83 % — AB (ref 43–77)
NEUTROS PCT: 85 % — AB (ref 43–77)
Neutro Abs: 19.3 10*3/uL — ABNORMAL HIGH (ref 1.7–7.7)
Neutro Abs: 23.6 10*3/uL — ABNORMAL HIGH (ref 1.7–7.7)
Platelets: 229 10*3/uL (ref 150–400)
Platelets: 235 10*3/uL (ref 150–400)
RBC: 5.28 MIL/uL (ref 4.22–5.81)
RBC: 4.98 MIL/uL (ref 4.22–5.81)
RDW: 17.1 % — AB (ref 11.5–15.5)
RDW: 17.4 % — AB (ref 11.5–15.5)
WBC: 23.3 10*3/uL — AB (ref 4.0–10.5)
WBC: 27.9 10*3/uL — ABNORMAL HIGH (ref 4.0–10.5)

## 2014-06-01 LAB — APTT: aPTT: 29 seconds (ref 24–37)

## 2014-06-01 LAB — PROCALCITONIN: PROCALCITONIN: 0.43 ng/mL

## 2014-06-01 LAB — PROTIME-INR
INR: 1.26 (ref 0.00–1.49)
PROTHROMBIN TIME: 15.9 s — AB (ref 11.6–15.2)

## 2014-06-01 LAB — LACTIC ACID, PLASMA
LACTIC ACID, VENOUS: 2.8 mmol/L — AB (ref 0.5–2.0)
LACTIC ACID, VENOUS: 4 mmol/L — AB (ref 0.5–2.0)

## 2014-06-01 MED ORDER — ASPIRIN 325 MG PO TABS
650.0000 mg | ORAL_TABLET | Freq: Once | ORAL | Status: DC
  Filled 2014-06-01: qty 2

## 2014-06-01 MED ORDER — SODIUM CHLORIDE 0.9 % IV BOLUS (SEPSIS)
1000.0000 mL | Freq: Once | INTRAVENOUS | Status: AC
  Administered 2014-06-01: 1000 mL via INTRAVENOUS

## 2014-06-01 MED ORDER — DIPHENHYDRAMINE HCL 12.5 MG/5ML PO ELIX: 12.5000 mg | ORAL_SOLUTION | Freq: Four times a day (QID) | ORAL | Status: DC | PRN

## 2014-06-01 MED ORDER — MORPHINE SULFATE (CONCENTRATE) 10 MG/0.5ML PO SOLN
2.5000 mg | ORAL | Status: DC | PRN
  Administered 2014-06-01: 2.6 mg via ORAL
  Filled 2014-06-01: qty 0.5

## 2014-06-01 MED ORDER — LEVOTHYROXINE SODIUM 150 MCG PO TABS
150.0000 ug | ORAL_TABLET | Freq: Every day | ORAL | Status: DC
  Filled 2014-06-01: qty 1

## 2014-06-01 MED ORDER — POLYETHYLENE GLYCOL 3350 17 G PO PACK
17.0000 g | PACK | Freq: Every day | ORAL | Status: DC | PRN
Start: 2014-06-01 — End: 2014-06-01

## 2014-06-01 MED ORDER — HYDROMORPHONE 0.3 MG/ML IV SOLN
INTRAVENOUS | Status: DC
  Administered 2014-06-01: 16:00:00 via INTRAVENOUS
  Filled 2014-06-01: qty 25

## 2014-06-01 MED ORDER — SODIUM CHLORIDE 0.9 % IV BOLUS (SEPSIS): 500.0000 mL | INTRAVENOUS | Status: DC

## 2014-06-01 MED ORDER — ONDANSETRON HCL 4 MG/2ML IJ SOLN: 4.0000 mg | Freq: Four times a day (QID) | INTRAMUSCULAR | Status: DC | PRN

## 2014-06-01 MED ORDER — DOXYCYCLINE HYCLATE 100 MG IV SOLR
100.0000 mg | Freq: Two times a day (BID) | INTRAVENOUS | Status: DC
  Filled 2014-06-01: qty 100

## 2014-06-01 MED ORDER — NALOXONE HCL 0.4 MG/ML IJ SOLN: 0.4000 mg | INTRAMUSCULAR | Status: DC | PRN

## 2014-06-01 MED ORDER — ALBUTEROL SULFATE (2.5 MG/3ML) 0.083% IN NEBU
INHALATION_SOLUTION | RESPIRATORY_TRACT | Status: AC
  Filled 2014-06-01: qty 3

## 2014-06-01 MED ORDER — PREDNISONE 20 MG PO TABS
40.0000 mg | ORAL_TABLET | Freq: Every day | ORAL | Status: DC
  Filled 2014-06-01: qty 2

## 2014-06-01 MED ORDER — ASPIRIN 81 MG PO TABS: 81.0000 mg | ORAL_TABLET | Freq: Every day | ORAL | Status: DC

## 2014-06-01 MED ORDER — HYDROCODONE-HOMATROPINE 5-1.5 MG/5ML PO SYRP
5.0000 mL | ORAL_SOLUTION | Freq: Four times a day (QID) | ORAL | Status: DC | PRN
  Filled 2014-06-01: qty 5

## 2014-06-01 MED ORDER — NINTEDANIB ESYLATE 150 MG PO CAPS: 150.0000 mg | ORAL_CAPSULE | Freq: Two times a day (BID) | ORAL | Status: DC

## 2014-06-01 MED ORDER — SODIUM CHLORIDE 0.9 % IV BOLUS (SEPSIS): 1000.0000 mL | INTRAVENOUS | Status: DC

## 2014-06-01 MED ORDER — TAMSULOSIN HCL 0.4 MG PO CAPS
0.4000 mg | ORAL_CAPSULE | Freq: Every day | ORAL | Status: DC
  Filled 2014-06-01: qty 1

## 2014-06-01 MED ORDER — CEFTRIAXONE SODIUM IN DEXTROSE 20 MG/ML IV SOLN
1.0000 g | INTRAVENOUS | Status: DC
  Filled 2014-06-01: qty 50

## 2014-06-01 MED ORDER — DIPHENHYDRAMINE HCL 50 MG/ML IJ SOLN: 12.5000 mg | Freq: Four times a day (QID) | INTRAMUSCULAR | Status: DC | PRN

## 2014-06-01 MED ORDER — ACETAMINOPHEN 325 MG PO TABS
650.0000 mg | ORAL_TABLET | Freq: Once | ORAL | Status: AC
  Administered 2014-06-01: 650 mg via ORAL
  Filled 2014-06-01: qty 2

## 2014-06-01 MED ORDER — PIPERACILLIN-TAZOBACTAM 3.375 G IVPB 30 MIN
3.3750 g | Freq: Once | INTRAVENOUS | Status: AC
  Administered 2014-06-01: 3.375 g via INTRAVENOUS
  Filled 2014-06-01: qty 50

## 2014-06-01 MED ORDER — DORZOLAMIDE HCL-TIMOLOL MAL 2-0.5 % OP SOLN
1.0000 [drp] | Freq: Two times a day (BID) | OPHTHALMIC | Status: DC
  Filled 2014-06-01: qty 10

## 2014-06-01 MED ORDER — ROSUVASTATIN CALCIUM 40 MG PO TABS: 40.0000 mg | ORAL_TABLET | ORAL | Status: DC

## 2014-06-01 MED ORDER — ASPIRIN EC 81 MG PO TBEC: 81.0000 mg | DELAYED_RELEASE_TABLET | Freq: Every day | ORAL | Status: DC

## 2014-06-01 MED ORDER — ALPRAZOLAM 0.25 MG PO TABS: 0.2500 mg | ORAL_TABLET | Freq: Three times a day (TID) | ORAL | Status: DC | PRN

## 2014-06-01 MED ORDER — HEPARIN SODIUM (PORCINE) 5000 UNIT/ML IJ SOLN
5000.0000 [IU] | Freq: Three times a day (TID) | INTRAMUSCULAR | Status: DC
  Filled 2014-06-01 (×3): qty 1

## 2014-06-01 MED ORDER — MORPHINE SULFATE 4 MG/ML IJ SOLN
4.0000 mg | INTRAMUSCULAR | Status: DC | PRN
  Administered 2014-06-01: 4 mg via INTRAVENOUS
  Filled 2014-06-01: qty 1

## 2014-06-01 MED ORDER — MORPHINE SULFATE 4 MG/ML IJ SOLN
4.0000 mg | Freq: Once | INTRAMUSCULAR | Status: AC
  Administered 2014-06-01: 4 mg via INTRAVENOUS
  Filled 2014-06-01: qty 1

## 2014-06-01 MED ORDER — ONDANSETRON HCL 4 MG PO TABS: 4.0000 mg | ORAL_TABLET | Freq: Four times a day (QID) | ORAL | Status: DC | PRN

## 2014-06-01 MED ORDER — PIPERACILLIN-TAZOBACTAM 4.5 G IVPB: 4.5000 g | Freq: Once | INTRAVENOUS | Status: DC

## 2014-06-01 MED ORDER — ROSUVASTATIN CALCIUM 20 MG PO TABS
20.0000 mg | ORAL_TABLET | ORAL | Status: DC
  Filled 2014-06-01: qty 1

## 2014-06-01 MED ORDER — BRIMONIDINE TARTRATE 0.15 % OP SOLN
1.0000 [drp] | Freq: Two times a day (BID) | OPHTHALMIC | Status: DC
  Filled 2014-06-01: qty 5

## 2014-06-01 MED ORDER — ROSUVASTATIN CALCIUM 20 MG PO TABS: 20.0000 mg | ORAL_TABLET | ORAL | Status: DC

## 2014-06-01 MED ORDER — MORPHINE SULFATE (CONCENTRATE) 20 MG/ML PO SOLN: 2.5000 mg | ORAL | Status: DC | PRN

## 2014-06-01 MED ORDER — SODIUM CHLORIDE 0.9 % IJ SOLN: 9.0000 mL | INTRAMUSCULAR | Status: DC | PRN

## 2014-06-01 MED ORDER — ALBUTEROL SULFATE (2.5 MG/3ML) 0.083% IN NEBU: 2.5000 mg | INHALATION_SOLUTION | Freq: Four times a day (QID) | RESPIRATORY_TRACT | Status: DC | PRN

## 2014-06-03 LAB — URINE CULTURE
COLONY COUNT: NO GROWTH
Colony Count: >=100000
Culture: NO GROWTH

## 2014-06-07 LAB — CULTURE, BLOOD (ROUTINE X 2)
CULTURE: NO GROWTH
CULTURE: NO GROWTH

## 2014-06-08 NOTE — ED Notes (Signed)
Bed: FM38 Expected date: 2014/06/22 Expected time: 7:09 AM Means of arrival: Ambulance Comments: Abd pain

## 2014-06-08 NOTE — ED Notes (Signed)
Dr Reather Converse made aware of abnormal lactic.

## 2014-06-08 NOTE — ED Notes (Addendum)
Per EMS: Pt is hospice pt for pulmonary fibrosis.  Pt normally on twenty to twenty eight liters of 02 via [facility] and [place].  Pt's normal 02 sat is between 82-88% on 28 L.  Pt c/o lt testicular pain since 0230.  Woke him up from sleep.  Hospice nurse gave 5 of morphine before he left.

## 2014-06-08 NOTE — ED Notes (Signed)
Report given to [place], [place]

## 2014-06-08 NOTE — Progress Notes (Addendum)
GIP visit- Bernard Griffith- [facility] ED17[facility] RN  Related, covered admission to his hospice dx of Pulmonary Fibrosis, verified with Dr. Tomasa Hosteller. Patient is a DNR CODE. Patient lying in stretcher complaining of pain, staff nurse giving him IV morphine for the pain. Family at beside no concerns at this time. Patient will be admitted for Sepsis and will receive IV antibiotics. Spoke with Dr. Venetia Constable about patients admission.Transfer summary and medication list left in ED slot for patients chart.  Please call with any hospice needs [phone] Thank you [facility] Liaison

## 2014-06-08 NOTE — Telephone Encounter (Signed)
Call from hospice nurse reporting that patient had abrupt onset of left testicular pain that has increased in intensity.  Nurse evaluated patient and describes the left testicle as hard, swollen and painful.  Concern for testicular torsion.  I have instructed the hospice nurse to have the patient go to the local ER for evaluation and treatment.

## 2014-06-08 NOTE — ED Notes (Signed)
Bed: WA17 Expected date: 06/19/14 Expected time: 7:03 AM Means of arrival: Ambulance Comments: Testicular pain- Hospice Pt

## 2014-06-08 NOTE — H&P (Signed)
[facility] History and Physical  [name] HYW:[id number] DOB: Feb 03, 1941 DOA: 06/05/14  Referring physician: Dr. Jerilee Field PCP: Donnie Coffin, MD   Chief Complaint: right scrotal pain  HPI: Bernard Griffith is a 74 y.o. male with past medical history of pulmonary fibrosis oxygen dependent DO NOT RESUSCITATE that comes in for right scrotal pain that started the day prior to admission. He relates with an aching pain which progressively got worse to the point where he couldn't sleep, he also relates some swelling and tenderness with touch. He denies any burning when he urinates. No trauma or cuts or sores.  In the ED: Scrotal ultrasound was done that showed epididymitis, a lactic acid of 2.7 with a white count of 23, with a left shift and a temperature 100.8 so we are consulted for further evaluation.   Review of Systems:  Constitutional:  No weight loss, night sweats, Fevers, chills, fatigue.  HEENT:  No headaches, Difficulty swallowing,Tooth/dental problems,Sore throat,  No sneezing, itching, ear ache, nasal congestion, post nasal drip,  Cardio-vascular:  No chest pain, Orthopnea, PND, swelling in lower extremities, anasarca, dizziness, palpitations  GI:  No heartburn, indigestion, abdominal pain, nausea, vomiting, diarrhea, change in bowel habits, loss of appetite  Resp:  . No excess mucus, no productive cough, No non-productive cough, No coughing up of blood.No change in color of mucus.No wheezing.No chest wall deformity  Skin:  no rash or lesions.  GU:  no dysuria, change in color of urine, no urgency or frequency. No flank pain.  Musculoskeletal:  No joint pain or swelling. No decreased range of motion. No back pain.  Psych:  No change in mood or affect. No depression or anxiety. No memory loss.   Past Medical History  Diagnosis Date  . Prostate cancer   . Pulmonary fibrosis   . Hypercholesteremia   . Hypothyroidism   . GERD (gastroesophageal reflux disease)     Past Surgical History  Procedure Laterality Date  . Insertion prostate radiation seed  2012   Social History:  reports that he quit smoking about 54 years ago. His smoking use included Cigarettes. He has a 1 pack-year smoking history. He has never used smokeless tobacco. He reports that he does not drink alcohol or use illicit drugs.  No Known Allergies  Family History  Problem Relation Age of Onset  . Heart attack Mother     Prior to Admission medications   Medication Sig Start Date End Date Taking? Authorizing Provider  albuterol (PROVENTIL) (2.5 MG/3ML) 0.083% nebulizer solution Take 3 mLs (2.5 mg total) by nebulization every 6 (six) hours as needed for wheezing or shortness of breath. 05/26/14  Yes Juanito Doom, MD  ALPRAZolam Duanne Moron) 0.25 MG tablet Take 1 tablet (0.25 mg total) by mouth 3 (three) times daily as needed for anxiety or sleep. 05/08/14  Yes Deneise Lever, MD  aspirin 325 MG tablet Take 650 mg by mouth once.   Yes Historical Provider, MD  aspirin 81 MG tablet Take 81 mg by mouth daily.   Yes Historical Provider, MD  brimonidine (ALPHAGAN) 0.15 % ophthalmic solution Place 1 drop into the right eye 2 (two) times daily.   Yes Historical Provider, MD  dorzolamide-timolol (COSOPT) 22.3-6.8 MG/ML ophthalmic solution Place 1 drop into the right eye 2 (two) times daily.   Yes Historical Provider, MD  HYDROcodone-homatropine (HYCODAN) 5-1.5 MG/5ML syrup Take 5 mLs by mouth every 6 (six) hours as needed for cough. 05/22/14  Yes Deneise Lever, MD  levothyroxine (SYNTHROID, LEVOTHROID) 150 MCG tablet Take 150 mcg by mouth daily before breakfast.   Yes Historical Provider, MD  morphine (ROXANOL) 20 MG/ML concentrated solution Take 0.13-0.25 mLs (2.6-5 mg total) by mouth every 4 (four) hours as needed for shortness of breath. 05/27/14  Yes Juanito Doom, MD  Multiple Vitamins-Minerals (MULTIVITAL PO) Take 1 tablet by mouth daily.   Yes Historical Provider, MD  Nintedanib  Esylate (OFEV) 150 MG CAPS One twice daily with food Patient taking differently: Take 150 mg by mouth 2 (two) times daily.  03/14/14  Yes Juanito Doom, MD  predniSONE (DELTASONE) 20 MG tablet Take 2 tablets (40 mg total) by mouth daily with breakfast. 05/27/14  Yes Juanito Doom, MD  rosuvastatin (CRESTOR) 10 MG tablet Take 20-49 mg by mouth See admin instructions. Take 40mg  on MWF, and 20mg  other days   Yes Historical Provider, MD  sodium chloride (OCEAN) 0.65 % SOLN nasal spray Place 1 spray into both nostrils 2 (two) times daily. 03/28/14  Yes Donita Brooks, NP  tamsulosin (FLOMAX) 0.4 MG CAPS capsule Take 0.4 mg by mouth daily.   Yes Historical Provider, MD  benzonatate (TESSALON) 200 MG capsule Take 1 capsule (200 mg total) by mouth 3 (three) times daily as needed for cough. Patient not taking: Reported on 06-14-2014 04/22/14   Juanito Doom, MD  HYDROcodone-homatropine [facility]) 5-1.5 MG/5ML syrup Take 5 mLs by mouth every 6 (six) hours as needed for cough. Patient not taking: Reported on 06/14/14 05/08/14   Deneise Lever, MD  morphine 20 MG/5ML solution Take 0.6-1.3 mLs (2.4-5.2 mg total) by mouth every 4 (four) hours as needed (shortness of breath). Patient not taking: Reported on 06-14-2014 05/26/14   Juanito Doom, MD  nystatin (MYCOSTATIN) 100000 UNIT/ML suspension Take 5 mLs (500,000 Units total) by mouth 3 (three) times daily as needed (thrush). Patient not taking: Reported on Jun 14, 2014 05/08/14   Juanito Doom, MD  pantoprazole (PROTONIX) 40 MG tablet Take 1 tablet (40 mg total) by mouth daily. Patient not taking: Reported on 06-14-2014 03/28/14   Donita Brooks, NP   Physical Exam: Filed Vitals:   Jun 14, 2014 0920 06/14/2014 0930 2014-06-14 1000 06-14-2014 1130  BP: 117/66 115/72 115/62 98/55  Pulse: 93 99 95 85  Temp:      TempSrc:      Resp: 46 38 26 30  Weight:      SpO2: 99% 94% 97% 95%    Wt Readings from Last 3 Encounters:  06-14-14 74.844 kg (165 lb)  04/14/14  72.576 kg (160 lb)  03/28/14 72.8 kg (160 lb 7.9 oz)    General:  Appears calm and comfortable Eyes: PERRL, normal lids, irises & conjunctiva ENT: grossly normal hearing, lips & tongue Neck: no LAD, masses or thyromegaly Cardiovascular: RRR, no m/r/g. No LE edema. Telemetry: SR, no arrhythmias  Respiratory: CTA bilaterally, no w/r/r. Normal respiratory effort. Abdomen: soft, ntnd Gen. exam: His right testicle is swollen, tender to touch and erythematous. Musculoskeletal: grossly normal tone BUE/BLE Psychiatric: grossly normal mood and affect, speech fluent and appropriate Neurologic: grossly non-focal.          Labs on Admission:  Basic Metabolic Panel:  Recent Labs Lab 14-Jun-2014 0754  NA 133*  K 4.5  CL 103  CO2 24  GLUCOSE 92  BUN 22  CREATININE 0.98  CALCIUM 8.9   Liver Function Tests: No results for input(s): AST, ALT, ALKPHOS, BILITOT, PROT, ALBUMIN in the last 168 hours. No  results for input(s): LIPASE, AMYLASE in the last 168 hours. No results for input(s): AMMONIA in the last 168 hours. CBC:  Recent Labs Lab 06/05/14 0754  WBC 23.3*  NEUTROABS 19.3*  HGB 15.8  HCT 47.6  MCV 90.2  PLT 229   Cardiac Enzymes: No results for input(s): CKTOTAL, CKMB, CKMBINDEX, TROPONINI in the last 168 hours.  BNP (last 3 results)  Recent Labs  03/24/14 1800  BNP 1148.4*    ProBNP (last 3 results) No results for input(s): PROBNP in the last 8760 hours.  CBG: No results for input(s): GLUCAP in the last 168 hours.  Radiological Exams on Admission: US Scrotum  June 05, 2014   CLINICAL DATA:  Left scrotal pain. Leukocytosis. History of prostate cancer.  EXAM: SCROTAL ULTRASOUND  DOPPLER ULTRASOUND OF THE TESTICLES  TECHNIQUE: Complete ultrasound examination of the testicles, epididymis, and other scrotal structures was performed. Color and spectral Doppler ultrasound were also utilized to evaluate blood flow to the testicles.  COMPARISON:  None.  FINDINGS: Right  testicle  Measurements: 4.6 x 4.1 x 2.5 cm. No mass or microlithiasis visualized.  Left testicle  Measurements: 5.6 x 3.8 x 3.2 cm. Heterogeneous with more focal, rounded enlargement of the superior portion of the testicle. The superior portion also is slightly more hypoechoic than the remainder of the testicle. Normal internal blood flow with color Doppler.  Right epididymis:  Normal in size and appearance.  Left epididymis: Diffusely enlarged and heterogeneous with normal internal blood flow with color Doppler. 6 mm cyst in the head of the epididymis.  Hydrocele:  Small bilateral, larger on the left.  Varicocele:  None visualized.  Other findings:  Left scrotal skin thickening.  Pulsed Doppler interrogation of both testes demonstrates normal low resistance arterial and venous waveforms bilaterally.  IMPRESSION: 1. Neoplastic or low-grade infectious process involving left testicle and epididymis. 2. Small left epididymal cyst. 3. Bilateral hydroceles, larger on the left. 4. Diffuse left scrotal skin thickening. 5. No evidence of testicular torsion.   Electronically Signed   By: Claudie Revering M.D.   On: 2014/06/05 09:29   [place] Art/ven Flow Abd Pelv Doppler  06-05-14   CLINICAL DATA:  Left scrotal pain. Leukocytosis. History of prostate cancer.  EXAM: SCROTAL ULTRASOUND  DOPPLER ULTRASOUND OF THE TESTICLES  TECHNIQUE: Complete ultrasound examination of the testicles, epididymis, and other scrotal structures was performed. Color and spectral Doppler ultrasound were also utilized to evaluate blood flow to the testicles.  COMPARISON:  None.  FINDINGS: Right testicle  Measurements: 4.6 x 4.1 x 2.5 cm. No mass or microlithiasis visualized.  Left testicle  Measurements: 5.6 x 3.8 x 3.2 cm. Heterogeneous with more focal, rounded enlargement of the superior portion of the testicle. The superior portion also is slightly more hypoechoic than the remainder of the testicle. Normal internal blood flow with color Doppler.  Right  epididymis:  Normal in size and appearance.  Left epididymis: Diffusely enlarged and heterogeneous with normal internal blood flow with color Doppler. 6 mm cyst in the head of the epididymis.  Hydrocele:  Small bilateral, larger on the left.  Varicocele:  None visualized.  Other findings:  Left scrotal skin thickening.  Pulsed Doppler interrogation of both testes demonstrates normal low resistance arterial and venous waveforms bilaterally.  IMPRESSION: 1. Neoplastic or low-grade infectious process involving left testicle and epididymis. 2. Small left epididymal cyst. 3. Bilateral hydroceles, larger on the left. 4. Diffuse left scrotal skin thickening. 5. No evidence of testicular torsion.   Electronically Signed  By: Claudie Revering M.D.   On: Jun 26, 2014 09:29   Dg Chest Port 1 View  Jun 26, 2014   CLINICAL DATA:  Dyspnea.  Pulmonary fibrosis.  Prostate cancer.  EXAM: PORTABLE CHEST - 1 VIEW  COMPARISON:  03/25/2014.  FINDINGS: Stable enlarged cardiac silhouette and prominent interstitial markings. Mild scoliosis.  IMPRESSION: No acute abnormality. Stable cardiomegaly and interstitial fibrosis.   Electronically Signed   By: Claudie Revering M.D.   On: 2014-06-26 09:15    EKG: Independently reviewed. none  Assessment/Plan Sepsis due to   Acute epididymitis/leukocytosis: - I will go ahead and start him on the sepsis pathway, start him on IV fluids, trending lactic acid. - The ED started him on Zosyn and will de-escalate this to Rocephin and doxycycline IV. - Check blood cultures 2, monitor strict I's and O's.  Chronic respiratory failure with hypoxia due to IPF (idiopathic pulmonary fibrosis): - Continue oxygen.  Mild hyponatremia: Due to hypovolemia start IV fluids..  DNR (do not resuscitate):   Code Status: DNR/DNI DVT Prophylaxis:heaprin Family Communication: wife Disposition Plan: d/c 3-4 days  Time spent: 27 min  FELIZ Marguarite Arbour [facility] Pager [phone]

## 2014-06-08 NOTE — Progress Notes (Signed)
This morning when patient was admitted to the floor. RT was called to the room to discuss the patients breathing. Pt was on a 15L nasal cannula.O2 sats were 97% Family stated the patient wears 2 10 liter nasal cannulas at home since he has pulmonary fibrosis. Rt asked about an oxyimizer family says patient could not tolerate that. RT placed the patient on a Non rebreather O2 sats were 92%. Pt was slighty short of breath at this time.RT also suggested continuous pulse ox. Dr Olevia Bowens was paged and asked if patient should be moved to step down. He said no due to the fact the patient has advanced lung disease and his breathing is at baseline. Pt is a DNR. He suggested some morphine. Rt called back to room around 1630 patient is in distress. He is now on 2 10L nasal cannulas. O2 sats are 92%. Placed patient on 10L nasal cannula and Nonrebreather to try to get the  patient comfortable. RN at bedside. Patient is being transferred to icu. Rt suggested high flow nasal cannula.

## 2014-06-08 NOTE — Telephone Encounter (Signed)
Called by wife. Patient is on 7W at [facility]. Wife wants him transferred to stepdown. Order has already been written by the [facility].

## 2014-06-08 NOTE — Discharge Summary (Signed)
RE:  PCA Dilaudid. Unable to access pt's record in [facility].  Wasted 19 ml, witnessed by [employer].

## 2014-06-08 NOTE — Discharge Summary (Signed)
RN received admission report from ED nurse, Delsa Sale, who stated that pt had h/o pulmonary fibrosis and was receiving hospice care, although he was being admitted for testicular pain, edema, and redness.  ED nurse said pt reported using 20-28L of O2 at home and was using rebreather along with [place] in ED.  Also reported that pt manages his O2 independently at home and advises staff regarding equipment and O2 amounts he requires/prefers/wants.  This RN advised Camera operator, Horris Latino, of upcoming admission and pt's high O2 needs.  Pt arrived to unit, RT was contacted, and RT spoke with pt/family and set up O2 equipment to accommodate pt's needs and desires.  Noted pt with increased RR consistent with report from ED as pt's reported baseline (mid-30's).  Also noted increased work of breathing/SOB which was discussed with RT.  Contacted admitting MD to determine if pt should be moved to stepdown unit and requested RT speak with MD directly regarding pt's respiratory status, which she did.  MD advised RN to give dose of po concentrated morphine and agreed with suggestion that pt be put on continuous pulse ox, which were done.  Catering manager of conversation with MD and decision to keep pt on unit, given pt's breathing and respiratory status were at baseline and his h/o advanced lung disease with DNR status.  Staff and pt (as he desired) monitored his O2 sats with goal of keeping pt's O2 sats above at 90% or greater.    Later in afternoon, pt/family reported pt's O2 stats fluctuating, dropping to 88%, then going up to 92%, and pulse ox monitor frequently alarming, although pt did not appear to have increased difficulty breathing at this time.  Attempted to adjust O2 sat monitor and heat pt's hand; pt did not report SOB.  Later family called RN into room and said they noticed an increased difficulty in breathing and thought pt needed more oxygen.  RT and Charge Nurse contacted and came to room for additional assessment of O2  delivery system and pt's respiratory status.  Charge Nurse had been providing additional support to primary RN since pt's arrival, as pt's family very anxious and pt requiring frequent attention and additional time.  Charge nurse and RT modified equipment ([place] & nonrebreather) to address pt's respiratory needs.  During this time, RN was in room setting up PCA to address pt's testicular pain and initiating abx and continued to remain with pt.  Around 1630, pt reported increased difficulty in breathing and RN observed increased work of breathing.  Charge nurse advised family that pt receiving maximum available amount of O2.  Family had been with pt since his admission to floor; pt's spouse arrived later in afternoon and was requesting pt be moved to higher level of care.  Charge nurse contacted MD to request pt be moved while primary RN continued to remain with pt; MD approved and ICU bed was requested. While transfer pending, noted change in pt's breathing (rebreather bag not filling with air and hypoxia increasing as work of breathing increased) and called for Rapid Response.  Pt became unresponsive to voice or attempts at shaking him; RR nurse came to room and assessed/monitored pt's cardiac and respiratory status and discussed with family; RN paged MD.  Although pt had requested DNR, family initially requested Code Blue and Code was initiated but  then cancelled after family further discussed situation with RR nurse.  Pt expired.  Pastoral care was contacted and family received support from [facility].  Pt's  family spent time with pt and Doristine Bosworth and quietly left unit after being comforted.  Post-mortem care plan initiated as per protocol.

## 2014-06-08 NOTE — Discharge Summary (Signed)
Death Summary  Bernard Griffith JEH:[id number] DOB: July 10, 1940 DOA: 06-15-2014  PCP: Donnie Coffin, MD PCP/Office notified: No  Admit date: 15-Jun-2014 Date of Death: 06-15-14  Final Diagnoses:  Principal Problem:   Sepsis Active Problems:   Acute epididymitis   DNR (do not resuscitate)   Chronic respiratory failure with hypoxia   Hyponatremia   IPF (idiopathic pulmonary fibrosis)    History of present illness:  74 y.o. male with past medical history of pulmonary fibrosis oxygen dependent DO NOT RESUSCITATE that comes in for right scrotal pain that started the day prior to admission. He relates with an aching pain which progressively got worse to the point where he couldn't sleep, he also relates some swelling and tenderness with touch. He denies any burning when he urinates. No trauma or cuts or sores.  Hospital Course:  Sepsis due to Acute epididymitis/leukocytosis: - Started him on the sepsis pathway, start him on IV fluids. - On IV Rocephin and doxycycline. - Became hypoxic, and with no pulse. - family wanted him to be coded, but once told about the option they decided to stop. - Family at bedside comfortable with outcome.  Chronic respiratory failure with hypoxia due to IPF (idiopathic pulmonary fibrosis): - Continue oxygen. - Was placed on non rebreather, but still was hypoxic was explained to the family the poor outcome.   Time: 15 min  Signed:  Charlynne Cousins  [facility] 06/15/14, 5:34 PM

## 2014-06-08 NOTE — Progress Notes (Signed)
Chaplain paged to provide spiritual care to the family of Bernard Griffith after his sudden death. Family was grieving appropriately, but busied with trying to contact the funeral home to arrange care. Nurse and Chaplain spoke to family to assure them that the hospital would take care of notifying the funeral home and will ensure Bernard Griffith will be treated with the utmost respect and care.  [name] and relatives departed after they were told the hospital would make arrangements to have Bernard Griffith picked up by the funeral home.  Grief and comfort care provided.  Sallee Lange. Quenesha Douglass, DMin, MDiv, MA Chaplain

## 2014-06-08 NOTE — ED Provider Notes (Signed)
CSN: [id number]     Arrival date & time Jun 13, 2014  0711 History   First MD Initiated Contact with Patient 06-13-2014 [phone]     Chief Complaint  Patient presents with  . Testicle Pain     (Consider location/radiation/quality/duration/timing/severity/associated sxs/prior Treatment) HPI Comments: 74 year old male with pulmonary fibrosis, respiratory failure, on home oxygen ranging 20-28 L, on hospice for respiratory issues presents with left testicle pain since this morning. Patient does not have worsening shortness of breath, no current antibiotics. Patient does want blood work, antibiotics and anything treatable done. Patient is a DO NOT RESUSCITATE. No history of testicle issues or surgeries. Fever new this morning. Worse with palpation and movement  Patient is a 74 y.o. male presenting with testicular pain. The history is provided by the patient and medical records.  Testicle Pain This is a new problem. Associated symptoms include shortness of breath. Pertinent negatives include no chest pain, no abdominal pain and no headaches.    Past Medical History  Diagnosis Date  . Prostate cancer   . Pulmonary fibrosis   . Hypercholesteremia   . Hypothyroidism   . GERD (gastroesophageal reflux disease)    Past Surgical History  Procedure Laterality Date  . Insertion prostate radiation seed  2012   Family History  Problem Relation Age of Onset  . Family history unknown: Yes   History  Substance Use Topics  . Smoking status: Former Smoker -- 0.50 packs/day for 2 years    Types: Cigarettes    Quit date: 02/08/1960  . Smokeless tobacco: Never Used  . Alcohol Use: No    Review of Systems  Constitutional: Positive for fever. Negative for chills.  HENT: Negative for congestion.   Eyes: Negative for visual disturbance.  Respiratory: Positive for shortness of breath.   Cardiovascular: Negative for chest pain.  Gastrointestinal: Negative for vomiting and abdominal pain.  Genitourinary:  Positive for testicular pain. Negative for dysuria and flank pain.  Musculoskeletal: Negative for back pain, neck pain and neck stiffness.  Skin: Negative for rash.  Neurological: Negative for light-headedness and headaches.      Allergies  Review of patient's allergies indicates no known allergies.  Home Medications   Prior to Admission medications   Medication Sig Start Date End Date Taking? Authorizing Provider  albuterol (PROVENTIL) (2.5 MG/3ML) 0.083% nebulizer solution Take 3 mLs (2.5 mg total) by nebulization every 6 (six) hours as needed for wheezing or shortness of breath. 05/26/14   Juanito Doom, MD  ALPRAZolam Duanne Moron) 0.25 MG tablet Take 1 tablet (0.25 mg total) by mouth 3 (three) times daily as needed for anxiety or sleep. 05/08/14   Deneise Lever, MD  aspirin 81 MG tablet Take 81 mg by mouth daily.    Historical Provider, MD  benzonatate (TESSALON) 200 MG capsule Take 1 capsule (200 mg total) by mouth 3 (three) times daily as needed for cough. 04/22/14   Juanito Doom, MD  brimonidine (ALPHAGAN) 0.15 % ophthalmic solution Place 1 drop into the right eye 2 (two) times daily.    Historical Provider, MD  dorzolamide-timolol (COSOPT) 22.3-6.8 MG/ML ophthalmic solution Place 1 drop into the right eye 2 (two) times daily.    Historical Provider, MD  HYDROcodone-homatropine (HYCODAN) 5-1.5 MG/5ML syrup Take 5 mLs by mouth every 6 (six) hours as needed for cough. 05/08/14   Deneise Lever, MD  HYDROcodone-homatropine (HYCODAN) 5-1.5 MG/5ML syrup Take 5 mLs by mouth every 6 (six) hours as needed for cough. 05/22/14   [place]  Lucia Estelle, MD  levothyroxine (SYNTHROID, LEVOTHROID) 150 MCG tablet Take 150 mcg by mouth daily before breakfast.    Historical Provider, MD  morphine (ROXANOL) 20 MG/ML concentrated solution Take 0.13-0.25 mLs (2.6-5 mg total) by mouth every 4 (four) hours as needed for shortness of breath. 05/27/14   Juanito Doom, MD  morphine 20 MG/5ML solution Take  0.6-1.3 mLs (2.4-5.2 mg total) by mouth every 4 (four) hours as needed (shortness of breath). 05/26/14   Juanito Doom, MD  Multiple Vitamins-Minerals (MULTIVITAL PO) Take 1 tablet by mouth daily.    Historical Provider, MD  Nintedanib Esylate (OFEV) 150 MG CAPS One twice daily with food Patient taking differently: Take 150 mg by mouth 2 (two) times daily.  03/14/14   Juanito Doom, MD  nystatin (MYCOSTATIN) 100000 UNIT/ML suspension Take 5 mLs (500,000 Units total) by mouth 3 (three) times daily as needed (thrush). 05/08/14   Juanito Doom, MD  pantoprazole (PROTONIX) 40 MG tablet Take 1 tablet (40 mg total) by mouth daily. 03/28/14   Donita Brooks, NP  predniSONE (DELTASONE) 20 MG tablet Take 2 tablets (40 mg total) by mouth daily with breakfast. 05/27/14   Juanito Doom, MD  rosuvastatin (CRESTOR) 10 MG tablet Take 40mg  on MWF, and 20mg  other days    Historical Provider, MD  sodium chloride (OCEAN) 0.65 % SOLN nasal spray Place 1 spray into both nostrils 2 (two) times daily. 03/28/14   Donita Brooks, NP  tamsulosin (FLOMAX) 0.4 MG CAPS capsule Take 0.4 mg by mouth daily.    Historical Provider, MD   BP 117/66 mmHg  Pulse 93  Temp(Src) 100.8 F (38.2 C) (Rectal)  Resp 46  SpO2 99% Physical Exam  Constitutional: He is oriented to person, place, and time. He appears well-developed and well-nourished.  HENT:  Head: Normocephalic and atraumatic.  Eyes: Conjunctivae are normal. Right eye exhibits no discharge. Left eye exhibits no discharge.  Neck: Normal range of motion. Neck supple. No tracheal deviation present.  Cardiovascular: Normal rate and regular rhythm.   Pulmonary/Chest: He has rales (mild bilateral, tachypnea).  Abdominal: Soft. He exhibits no distension. There is no tenderness. There is no guarding.  Genitourinary:  Patient has swollen tender testicle, most tender posterior aspect, no streaking erythema, no crepitus. Mild indurated.  Musculoskeletal: He exhibits no  edema.  Neurological: He is alert and oriented to person, place, and time. No cranial nerve deficit.  Skin: Skin is warm. No rash noted.  Psychiatric: He has a normal mood and affect.  Nursing note and vitals reviewed.   ED Course  Procedures (including critical care time) Labs Review Labs Reviewed  CBC WITH DIFFERENTIAL/PLATELET - Abnormal; Notable for the following:    WBC 23.3 (*)    RDW 17.1 (*)    Neutrophils Relative % 83 (*)    Neutro Abs 19.3 (*)    Lymphocytes Relative 11 (*)    Monocytes Absolute 1.4 (*)    All other components within normal limits  BASIC METABOLIC PANEL - Abnormal; Notable for the following:    Sodium 133 (*)    GFR calc non Af Amer 79 (*)    All other components within normal limits  I-STAT CG4 LACTIC ACID, ED - Abnormal; Notable for the following:    Lactic Acid, Venous 2.79 (*)    All other components within normal limits  URINE CULTURE  CULTURE, BLOOD (ROUTINE X 2)  CULTURE, BLOOD (ROUTINE X 2)  URINALYSIS, ROUTINE W REFLEX  MICROSCOPIC    Imaging Review US Scrotum  07-01-2014   CLINICAL DATA:  Left scrotal pain. Leukocytosis. History of prostate cancer.  EXAM: SCROTAL ULTRASOUND  DOPPLER ULTRASOUND OF THE TESTICLES  TECHNIQUE: Complete ultrasound examination of the testicles, epididymis, and other scrotal structures was performed. Color and spectral Doppler ultrasound were also utilized to evaluate blood flow to the testicles.  COMPARISON:  None.  FINDINGS: Right testicle  Measurements: 4.6 x 4.1 x 2.5 cm. No mass or microlithiasis visualized.  Left testicle  Measurements: 5.6 x 3.8 x 3.2 cm. Heterogeneous with more focal, rounded enlargement of the superior portion of the testicle. The superior portion also is slightly more hypoechoic than the remainder of the testicle. Normal internal blood flow with color Doppler.  Right epididymis:  Normal in size and appearance.  Left epididymis: Diffusely enlarged and heterogeneous with normal internal blood  flow with color Doppler. 6 mm cyst in the head of the epididymis.  Hydrocele:  Small bilateral, larger on the left.  Varicocele:  None visualized.  Other findings:  Left scrotal skin thickening.  Pulsed Doppler interrogation of both testes demonstrates normal low resistance arterial and venous waveforms bilaterally.  IMPRESSION: 1. Neoplastic or low-grade infectious process involving left testicle and epididymis. 2. Small left epididymal cyst. 3. Bilateral hydroceles, larger on the left. 4. Diffuse left scrotal skin thickening. 5. No evidence of testicular torsion.   Electronically Signed   By: Claudie Revering M.D.   On: 07/01/14 09:29   [place] Art/ven Flow Abd Pelv Doppler  2014/07/01   CLINICAL DATA:  Left scrotal pain. Leukocytosis. History of prostate cancer.  EXAM: SCROTAL ULTRASOUND  DOPPLER ULTRASOUND OF THE TESTICLES  TECHNIQUE: Complete ultrasound examination of the testicles, epididymis, and other scrotal structures was performed. Color and spectral Doppler ultrasound were also utilized to evaluate blood flow to the testicles.  COMPARISON:  None.  FINDINGS: Right testicle  Measurements: 4.6 x 4.1 x 2.5 cm. No mass or microlithiasis visualized.  Left testicle  Measurements: 5.6 x 3.8 x 3.2 cm. Heterogeneous with more focal, rounded enlargement of the superior portion of the testicle. The superior portion also is slightly more hypoechoic than the remainder of the testicle. Normal internal blood flow with color Doppler.  Right epididymis:  Normal in size and appearance.  Left epididymis: Diffusely enlarged and heterogeneous with normal internal blood flow with color Doppler. 6 mm cyst in the head of the epididymis.  Hydrocele:  Small bilateral, larger on the left.  Varicocele:  None visualized.  Other findings:  Left scrotal skin thickening.  Pulsed Doppler interrogation of both testes demonstrates normal low resistance arterial and venous waveforms bilaterally.  IMPRESSION: 1. Neoplastic or low-grade  infectious process involving left testicle and epididymis. 2. Small left epididymal cyst. 3. Bilateral hydroceles, larger on the left. 4. Diffuse left scrotal skin thickening. 5. No evidence of testicular torsion.   Electronically Signed   By: Claudie Revering M.D.   On: 2014/07/01 09:29   Dg Chest Port 1 View  01-Jul-2014   CLINICAL DATA:  Dyspnea.  Pulmonary fibrosis.  Prostate cancer.  EXAM: PORTABLE CHEST - 1 VIEW  COMPARISON:  03/25/2014.  FINDINGS: Stable enlarged cardiac silhouette and prominent interstitial markings. Mild scoliosis.  IMPRESSION: No acute abnormality. Stable cardiomegaly and interstitial fibrosis.   Electronically Signed   By: Claudie Revering M.D.   On: Jul 01, 2014 09:15     EKG Interpretation None      MDM   Final diagnoses:  Dyspnea  Sepsis, due  to unspecified organism  Epididymitis, left  Pain in left testicle   Patient presents concern for sepsis with source testicle. Concern for abscess. Ultrasound results reviewed concern for epididymitis, no abscesses visualized. Patient's respiratory status at baseline. Blood cultures urine cultures ordered. Zosyn given after cultures. IV fluid boluses repeated.  Paged urology and medicine for admission for IV antibiotics and further resuscitation and treatment. Plan for stepdown.  The patients results and plan were reviewed and discussed.   Any x-rays performed were personally reviewed by myself.   Differential diagnosis were considered with the presenting HPI.  Medications  piperacillin-tazobactam (ZOSYN) IVPB 3.375 g (3.375 g Intravenous New Bag/Given 26-Jun-2014 0917)  sodium chloride 0.9 % bolus 1,000 mL (not administered)  sodium chloride 0.9 % bolus 1,000 mL (not administered)  morphine 4 MG/ML injection 4 mg (4 mg Intravenous Given 06/26/2014 0807)  acetaminophen (TYLENOL) tablet 650 mg (650 mg Oral Given 06-26-14 0810)  sodium chloride 0.9 % bolus 1,000 mL (0 mLs Intravenous Stopped Jun 26, 2014 0927)    Filed Vitals:    26-Jun-2014 0830 06/26/14 0900 Jun 26, 2014 0910 06/26/2014 0920  BP: 99/65 97/67 110/68 117/66  Pulse: 92 89 97 93  Temp:      TempSrc:      Resp: 23 32 36 46  SpO2: 100% 97% 87% 99%    Final diagnoses:  Dyspnea  Sepsis, due to unspecified organism  Epididymitis, left  Pain in left testicle    Admission/ observation were discussed with the admitting physician, patient and/or family and they are comfortable with the plan.      Elnora Morrison, MD 06/03/14 [phone]

## 2014-06-08 DEATH — deceased

## 2014-07-15 ENCOUNTER — Ambulatory Visit: Payer: Medicare Other | Admitting: Pulmonary Disease

## 2015-04-08 IMAGING — CR DG CHEST 2V
2 series · 2 of 2 positions shown · non-contrast
Comparison: 03/19/2010

CLINICAL DATA: Shortness of breath.

EXAM:
CHEST  2 VIEW

[w chest pa]
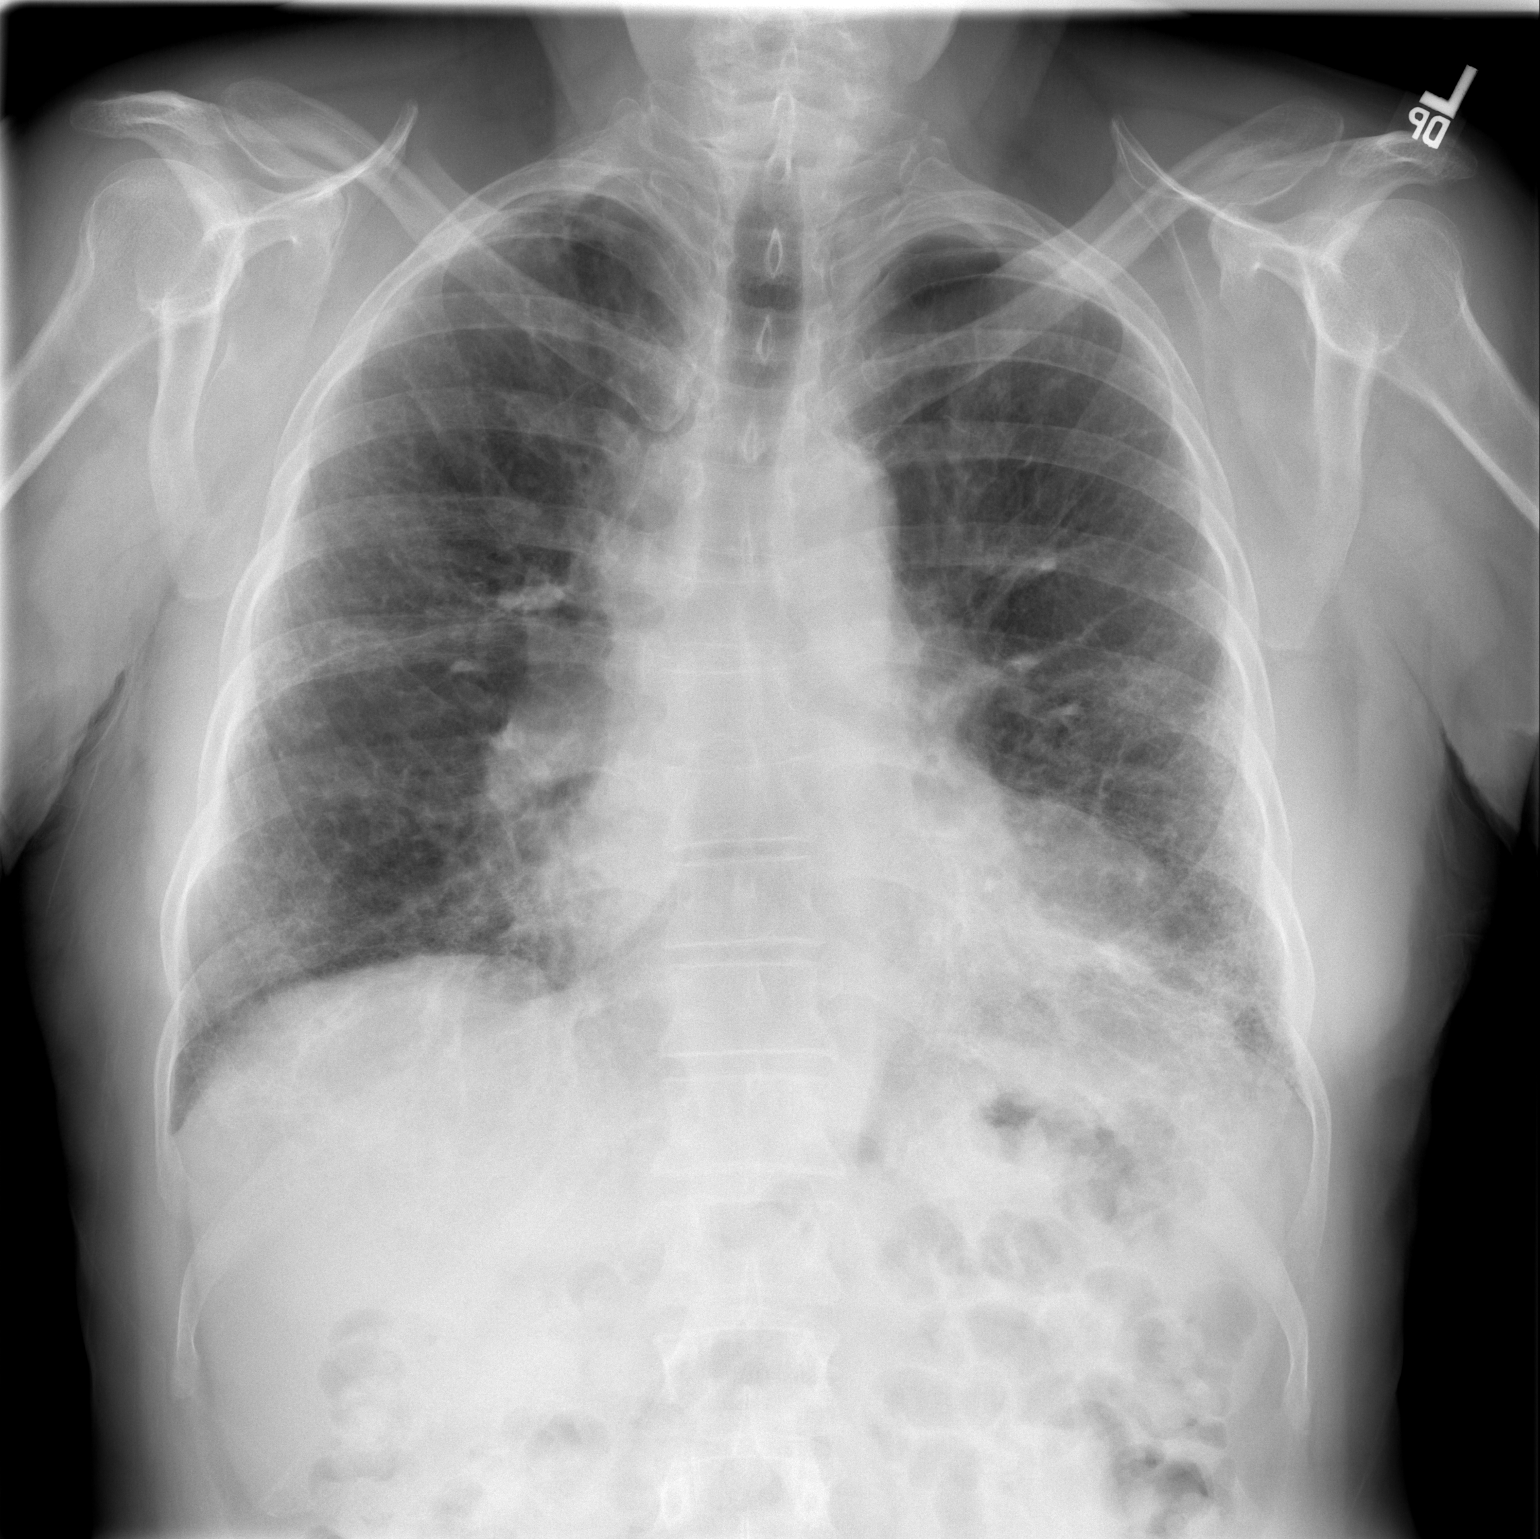

[w chest lat]
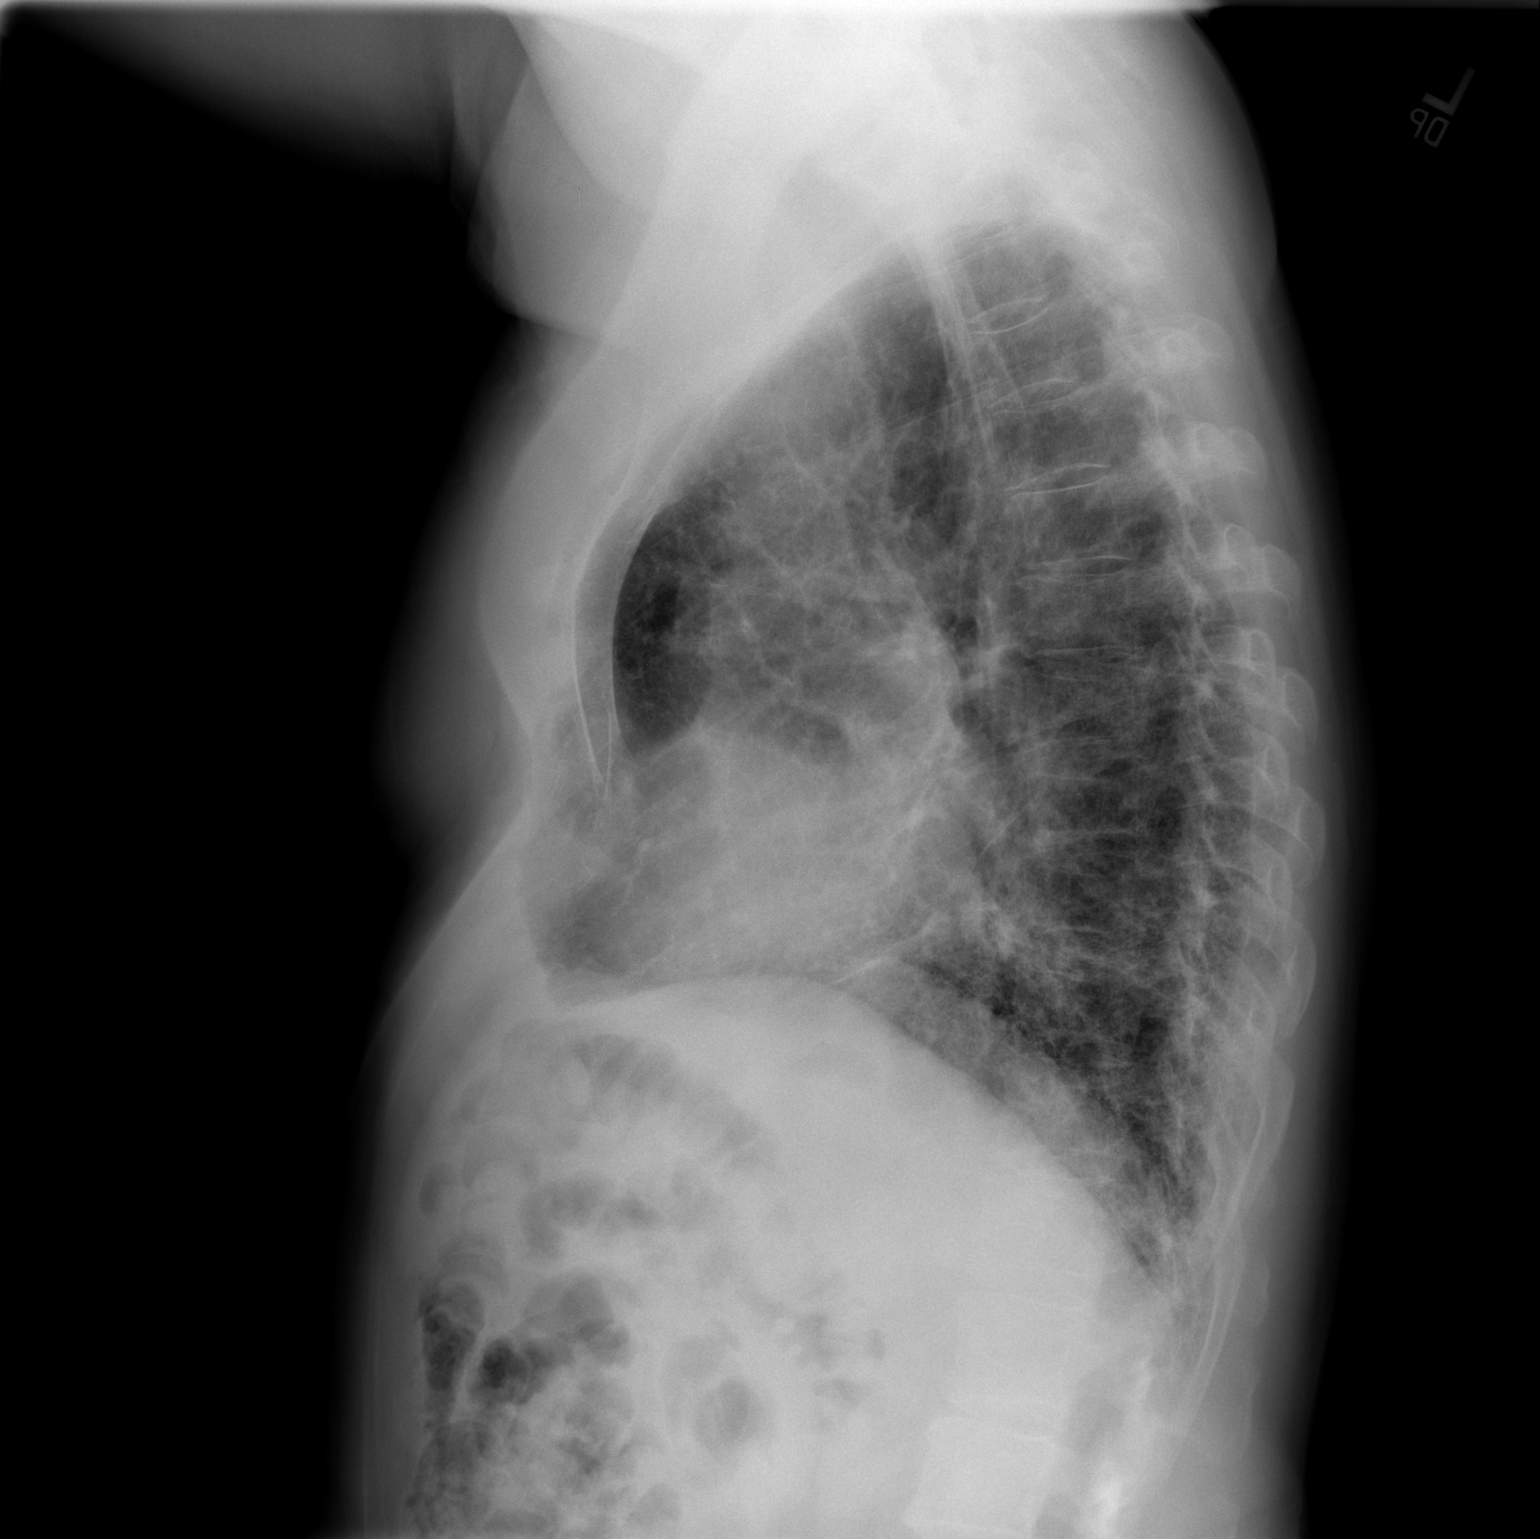

[2 of 2 positions shown; findings below may reference images not displayed]

FINDINGS: Pectus excavatum deformity is stable in appearance. Allowing for
this, cardiomediastinal silhouette is grossly within normal limits
and unchanged. The lungs are well inflated. There is increased
retrocardiac opacity involving the basilar left lower lobe.
Increased interstitial opacities throughout both lungs are similar
to the prior exam. No pleural effusion is identified. No acute
osseous abnormality is identified.
IMPRESSION: 1. Increased left lower lobe opacity, which could represent
atelectasis or infection.
2. Similar appearance of diffuse interstitial opacities throughout
both lungs, compatible with underlying chronic lung disease.
